# Patient Record
Sex: Female | Born: 1935 | Race: White | Hispanic: No | Smoking: Former smoker
Health system: Southern US, Community
[De-identification: ages and names within clinical notes are randomized; demographics above are authoritative.]

## PROBLEM LIST (undated history)

## (undated) DIAGNOSIS — I5030 Unspecified diastolic (congestive) heart failure: Secondary | ICD-10-CM

## (undated) DIAGNOSIS — E039 Hypothyroidism, unspecified: Secondary | ICD-10-CM

## (undated) DIAGNOSIS — Z9889 Other specified postprocedural states: Secondary | ICD-10-CM

## (undated) DIAGNOSIS — I10 Essential (primary) hypertension: Secondary | ICD-10-CM

## (undated) DIAGNOSIS — D649 Anemia, unspecified: Secondary | ICD-10-CM

## (undated) DIAGNOSIS — L989 Disorder of the skin and subcutaneous tissue, unspecified: Secondary | ICD-10-CM

## (undated) DIAGNOSIS — E611 Iron deficiency: Secondary | ICD-10-CM

## (undated) DIAGNOSIS — N189 Chronic kidney disease, unspecified: Secondary | ICD-10-CM

## (undated) DIAGNOSIS — M8580 Other specified disorders of bone density and structure, unspecified site: Secondary | ICD-10-CM

## (undated) DIAGNOSIS — M76829 Posterior tibial tendinitis, unspecified leg: Secondary | ICD-10-CM

## (undated) DIAGNOSIS — R269 Unspecified abnormalities of gait and mobility: Secondary | ICD-10-CM

## (undated) DIAGNOSIS — I509 Heart failure, unspecified: Secondary | ICD-10-CM

## (undated) DIAGNOSIS — E7800 Pure hypercholesterolemia, unspecified: Secondary | ICD-10-CM

## (undated) DIAGNOSIS — I639 Cerebral infarction, unspecified: Secondary | ICD-10-CM

## (undated) DIAGNOSIS — Z789 Other specified health status: Secondary | ICD-10-CM

## (undated) DIAGNOSIS — M545 Low back pain, unspecified: Secondary | ICD-10-CM

## (undated) DIAGNOSIS — M6289 Other specified disorders of muscle: Secondary | ICD-10-CM

## (undated) DIAGNOSIS — I6529 Occlusion and stenosis of unspecified carotid artery: Secondary | ICD-10-CM

## (undated) DIAGNOSIS — I6381 Other cerebral infarction due to occlusion or stenosis of small artery: Secondary | ICD-10-CM

## (undated) HISTORY — PX: CATARACT EXTRACTION: SUR2

## (undated) HISTORY — DX: Anemia, unspecified: D64.9

## (undated) HISTORY — DX: Low back pain, unspecified: M54.50

## (undated) HISTORY — DX: Posterior tibial tendinitis, unspecified leg: M76.829

## (undated) HISTORY — DX: Other specified disorders of bone density and structure, unspecified site: M85.80

## (undated) HISTORY — DX: Iron deficiency: E61.1

## (undated) HISTORY — DX: Pure hypercholesterolemia, unspecified: E78.00

## (undated) HISTORY — DX: Other specified health status: Z78.9

## (undated) HISTORY — DX: Unspecified abnormalities of gait and mobility: R26.9

## (undated) HISTORY — DX: Heart failure, unspecified: I50.9

## (undated) HISTORY — DX: Disorder of the skin and subcutaneous tissue, unspecified: L98.9

## (undated) HISTORY — DX: Other cerebral infarction due to occlusion or stenosis of small artery: I63.81

## (undated) HISTORY — PX: ABDOMINAL HYSTERECTOMY: SHX81

## (undated) HISTORY — DX: Hypothyroidism, unspecified: E03.9

## (undated) HISTORY — PX: KIDNEY STONE SURGERY: SHX686

## (undated) HISTORY — DX: Other specified disorders of muscle: M62.89

## (undated) HISTORY — PX: CARPAL TUNNEL RELEASE: SHX101

## (undated) HISTORY — DX: Essential (primary) hypertension: I10

## (undated) HISTORY — DX: Occlusion and stenosis of unspecified carotid artery: I65.29

## (undated) HISTORY — DX: Chronic kidney disease, unspecified: N18.9

## (undated) HISTORY — PX: BACK SURGERY: SHX140

## (undated) HISTORY — PX: LUMBAR SPINE SURGERY: SHX701

## (undated) HISTORY — DX: Unspecified diastolic (congestive) heart failure: I50.30

## (undated) HISTORY — DX: Cerebral infarction, unspecified: I63.9

## (undated) HISTORY — DX: Other specified postprocedural states: Z98.890

## (undated) HISTORY — PX: EXTERNAL EAR SURGERY: SHX627

---

## 2011-12-10 DIAGNOSIS — E039 Hypothyroidism, unspecified: Secondary | ICD-10-CM | POA: Insufficient documentation

## 2012-01-06 DIAGNOSIS — E785 Hyperlipidemia, unspecified: Secondary | ICD-10-CM | POA: Insufficient documentation

## 2012-01-06 DIAGNOSIS — N289 Disorder of kidney and ureter, unspecified: Secondary | ICD-10-CM | POA: Insufficient documentation

## 2012-01-06 DIAGNOSIS — M8580 Other specified disorders of bone density and structure, unspecified site: Secondary | ICD-10-CM | POA: Insufficient documentation

## 2014-02-04 DIAGNOSIS — N951 Menopausal and female climacteric states: Secondary | ICD-10-CM | POA: Insufficient documentation

## 2014-08-12 DIAGNOSIS — I10 Essential (primary) hypertension: Secondary | ICD-10-CM | POA: Insufficient documentation

## 2014-12-23 DIAGNOSIS — M76822 Posterior tibial tendinitis, left leg: Secondary | ICD-10-CM | POA: Insufficient documentation

## 2016-02-29 DIAGNOSIS — H3530 Unspecified macular degeneration: Secondary | ICD-10-CM | POA: Insufficient documentation

## 2016-04-21 DIAGNOSIS — Z789 Other specified health status: Secondary | ICD-10-CM | POA: Insufficient documentation

## 2016-06-25 DIAGNOSIS — D509 Iron deficiency anemia, unspecified: Secondary | ICD-10-CM | POA: Insufficient documentation

## 2016-08-14 DIAGNOSIS — M545 Low back pain, unspecified: Secondary | ICD-10-CM | POA: Insufficient documentation

## 2016-08-14 DIAGNOSIS — G8929 Other chronic pain: Secondary | ICD-10-CM | POA: Insufficient documentation

## 2019-07-07 DIAGNOSIS — Z8673 Personal history of transient ischemic attack (TIA), and cerebral infarction without residual deficits: Secondary | ICD-10-CM | POA: Insufficient documentation

## 2019-07-07 DIAGNOSIS — Z9889 Other specified postprocedural states: Secondary | ICD-10-CM | POA: Insufficient documentation

## 2019-07-07 DIAGNOSIS — N1832 Chronic kidney disease, stage 3b: Secondary | ICD-10-CM | POA: Insufficient documentation

## 2019-07-07 DIAGNOSIS — R269 Unspecified abnormalities of gait and mobility: Secondary | ICD-10-CM | POA: Insufficient documentation

## 2019-07-07 DIAGNOSIS — I6523 Occlusion and stenosis of bilateral carotid arteries: Secondary | ICD-10-CM | POA: Insufficient documentation

## 2019-07-07 DIAGNOSIS — I1310 Hypertensive heart and chronic kidney disease without heart failure, with stage 1 through stage 4 chronic kidney disease, or unspecified chronic kidney disease: Secondary | ICD-10-CM | POA: Insufficient documentation

## 2019-07-07 DIAGNOSIS — Z789 Other specified health status: Secondary | ICD-10-CM | POA: Insufficient documentation

## 2019-08-13 DIAGNOSIS — R54 Age-related physical debility: Secondary | ICD-10-CM | POA: Insufficient documentation

## 2020-05-31 NOTE — Progress Notes (Signed)
CARDIOLOGY CONSULT NOTE       Patient ID: Chelsea Moss MRN: [id number] DOB/AGE: 1926/12/09 87 y.o.  Admit date: (Not on file) Referring Physician: [facility]  Primary Physician: Lind Guest, NP Primary Cardiologist: New Reason for Consultation: PVD/HTN  HPI:  87 y.o. referred by Toney Rakes PA for HTN and PVD. Previously seen in [place] and once at [facility] cardiology She has a history of labile HTN End of August had spike and started seeing bright lights Felt clammy when EMS arrived bp fine and patient did not want to go to hospital to r/o TIA. Normally takes Olmesartan 40 mg , Laisx 20 mg , norvasc 10 mg and and clonidine  0.2 mg tid for her BP She has distant history of stroke Is intolerant to statins with myalgias She is on thyroid replacement and has history of anemia   Seen by Dr Beatrix F[facility] cardiology most recently 02/23/20 there is comment on bilateral carotid stenosis with right CEA and old Lacunar infarct She is on chronic plavix Quit smoking 1996 Chronic back pain and advanced age limits activity   Echo done 02/23/20 EF 70% mild LVH AV sclerosis MAC with mild AR/MR   Most recent labs with TSH 3 and Hct 34.5 has normal LFTls K and renal function   Her activity is very limited due to chronic back problems and balance She take an extra clonidine if BP spides She has a glass of wine nightly but no excess  She is very excitable and anxious    ROS All other systems reviewed and negative except as noted above  Past Medical History:  Diagnosis Date  . Anemia   . Carotid artery stenosis   . CHF (congestive heart failure) ([place])   . CKD (chronic kidney disease)   . Diastolic heart failure with preserved ejection fraction ([place])   . Gait disturbance   . High blood pressure   . High cholesterol   . History of CEA (carotid endarterectomy)   . Hypothyroidism   . Iron deficiency   . Lacunar infarction ([place])   . Low back pain without sciatica    . Muscular degeneration   . Osteopenia   . Skin lesion   . Statin intolerance   . Stroke (cerebrum) ([place])   . Tibialis posterior tendinitis     Family History  Problem Relation Age of Onset  . Heart disease Mother   . Emphysema Father     Social History   Socioeconomic History  . Marital status: Widowed    Spouse name: Not on file  . Number of children: Not on file  . Years of education: Not on file  . Highest education level: Not on file  Occupational History  . Not on file  Tobacco Use  . Smoking status: Former Smoker    Years: 15.00    Types: Cigarettes    Quit date: 06/01/1994    Years since quitting: 26.0  . Smokeless tobacco: Never Used  Substance and Sexual Activity  . Alcohol use: Yes  . Drug use: Never  . Sexual activity: Not on file  Other Topics Concern  . Not on file  Social History Narrative  . Not on file   Social Determinants of Health   Financial Resource Strain: Not on file  Food Insecurity: Not on file  Transportation Needs: Not on file  Physical Activity: Not on file  Stress: Not on file  Social Connections: Not on file  Intimate Partner Violence:  Not on file    Past Surgical History:  Procedure Laterality Date  . ABDOMINAL HYSTERECTOMY    . BACK SURGERY    . CARPAL TUNNEL RELEASE    . CATARACT EXTRACTION    . EXTERNAL EAR SURGERY    . KIDNEY STONE SURGERY    . LUMBAR SPINE SURGERY        Current Outpatient Medications:  .  amLODipine (NORVASC) 10 MG tablet, Take 10 mg by mouth daily., Disp: , Rfl:  .  cloNIDine (CATAPRES) 0.2 MG tablet, Take 0.2 mg by mouth 2 (two) times daily., Disp: , Rfl:  .  clopidogrel (PLAVIX) 75 MG tablet, Take 75 mg by mouth daily., Disp: , Rfl:  .  diclofenac (VOLTAREN) 75 MG EC tablet, Take 75 mg by mouth 2 (two) times daily., Disp: , Rfl:  .  estradiol (ESTRACE) 2 MG tablet, Take 2 mg by mouth daily., Disp: , Rfl:  .  ferrous sulfate 325 (65 FE) MG tablet, Take 325 mg by mouth daily with breakfast.,  Disp: , Rfl:  .  furosemide (LASIX) 20 MG tablet, Take 20 mg by mouth., Disp: , Rfl:  .  gabapentin (NEURONTIN) 600 MG tablet, Take 600 mg by mouth 3 (three) times daily., Disp: , Rfl:  .  levothyroxine (SYNTHROID) 25 MCG tablet, Take 25 mcg by mouth daily before breakfast., Disp: , Rfl:  .  pantoprazole (PROTONIX) 40 MG tablet, Take 40 mg by mouth daily., Disp: , Rfl:  .  potassium chloride (MICRO-K) 10 MEQ CR capsule, Take 10 mEq by mouth 2 (two) times daily., Disp: , Rfl:  .  triamcinolone (KENALOG) 0.1 %, Apply 1 application topically 2 (two) times daily., Disp: , Rfl:  .  valsartan (DIOVAN) 160 MG tablet, Take 1 tablet (160 mg total) by mouth daily., Disp: 90 tablet, Rfl: 3    Physical Exam: Blood pressure (!) 164/88, pulse (!) 54, height _0  (1.499 m), weight 71.2 kg, SpO2 97 %.   Affect appropriate Healthy:  appears stated age 70: normal Neck supple with no adenopathy JVP normal no bruits no thyromegaly Lungs clear with no wheezing and good diaphragmatic motion Heart:  S1/S2 no murmur, no rub, gallop or click PMI normal Abdomen: benighn, BS positve, no tenderness, no AAA no bruit.  No HSM or HJR Distal pulses intact with no bruits No edema Neuro non-focal Skin warm and dry No muscular weakness   Labs:  No results found for: WBC, HGB, HCT, MCV, PLT No results for input(s): NA, K, CL, CO2, BUN, CREATININE, CALCIUM, PROT, BILITOT, ALKPHOS, ALT, AST, GLUCOSE in the last 168 hours.  Invalid input(s): LABALBU No results found for: CKTOTAL, CKMB, CKMBINDEX, TROPONINI No results found for: CHOL No results found for: HDL No results found for: LDLCALC No results found for: TRIG No results found for: CHOLHDL No results found for: LDLDIRECT    Radiology: No results found.  EKG: SR rate 56 Normal    ASSESSMENT AND PLAN:   1. HTN: labile change benicar to valsartan which is a bit stronger continue other meds. Since her lability is related to anxiety I asked her to  see if her primary can prescribe her low dose PRN xanax  2. PVD:  No bruit will do f/u duplex in 6 months  3. Hypothyroidism:  Continue synthroid replacement TSH normal  4. Anemia:  F/u primary has had iron studies  5. HLD:  Statin intolerant given age observe   Time:  Spent reviewing outside records from primary  [place] , cardiology [place], direct patient interview exam composing note and discussing care with daughter 45 minutes   Signed: Jenkins Rouge 06/03/2020, 4:02 PM

## 2020-06-03 ENCOUNTER — Ambulatory Visit (INDEPENDENT_AMBULATORY_CARE_PROVIDER_SITE_OTHER): Payer: Medicare Other | Admitting: Cardiovascular Disease

## 2020-06-03 ENCOUNTER — Encounter: Payer: Self-pay | Admitting: Cardiovascular Disease

## 2020-06-03 ENCOUNTER — Other Ambulatory Visit: Payer: Self-pay

## 2020-06-03 VITALS — BP 164/88 | HR 54 | Ht 59.0 in | Wt 157.0 lb

## 2020-06-03 DIAGNOSIS — I6521 Occlusion and stenosis of right carotid artery: Secondary | ICD-10-CM

## 2020-06-03 DIAGNOSIS — I10 Essential (primary) hypertension: Secondary | ICD-10-CM | POA: Diagnosis not present

## 2020-06-03 MED ORDER — VALSARTAN 160 MG PO TABS: 160.0000 mg | ORAL_TABLET | Freq: Every day | ORAL | 3 refills | Status: DC

## 2020-06-03 NOTE — Patient Instructions (Signed)
Medication Instructions: \ Your physician has recommended you make the following change in your medication:  -- STOP BENICAR -- START Valsartan 160 mg - Take 1 tablet by mouth daily -- NEW RX SENT *If you need a refill on your cardiac medications before your next appointment, please call your pharmacy*  Testing/Procedures: Your physician has requested that you have a carotid duplex. This test is an ultrasound of the carotid arteries in your neck. It looks at blood flow through these arteries that supply the brain with blood. Allow one hour for this exam. There are no restrictions or special instructions.  Follow-Up: At [facility], you and your health needs are our priority.  As part of our continuing mission to provide you with exceptional heart care, we have created designated Provider Care Teams.  These Care Teams include your primary Cardiologist (physician) and Advanced Practice Providers (APPs -  Physician Assistants and Nurse Practitioners) who all work together to provide you with the care you need, when you need it.  We recommend signing up for the patient portal called "MyChart".  Sign up information is provided on this After Visit Summary.  MyChart is used to connect with patients for Virtual Visits (Telemedicine).  Patients are able to view lab/test results, encounter notes, upcoming appointments, etc.  Non-urgent messages can be sent to your provider as well.   To learn more about what you can do with MyChart, go to [email or web address].    Your next appointment:   Your physician recommends that you schedule a follow-up appointment in: 6 MONTHS with Dr. Eden Emms -- Same day as Carotid Doppler

## 2020-07-05 ENCOUNTER — Telehealth: Payer: Self-pay | Admitting: Cardiovascular Disease

## 2020-07-05 NOTE — Telephone Encounter (Signed)
    Pt c/o medication issue:  1. Name of Medication: valsartan (DIOVAN) 160 MG tablet  2. How are you currently taking this medication (dosage and times per day)?   Take 1 tablet (160 mg total) by mouth daily.   3. Are you having a reaction (difficulty breathing--STAT)?   4. What is your medication issue? Pt said her feet and legs swelling after taking this meds. She said she never had a problem like this when she was taking losartan

## 2020-07-06 ENCOUNTER — Other Ambulatory Visit: Payer: Self-pay

## 2020-07-06 MED ORDER — FUROSEMIDE 20 MG PO TABS: 20.0000 mg | ORAL_TABLET | Freq: Every day | ORAL | 3 refills | Status: DC

## 2020-07-06 MED ORDER — POTASSIUM CHLORIDE ER 10 MEQ PO CPCR: 10.0000 meq | ORAL_CAPSULE | Freq: Every day | ORAL | 3 refills | Status: DC

## 2020-07-06 NOTE — Telephone Encounter (Signed)
Pt calling stating that she needed her medication resent to [facility] mail order pharmacy. Medication resent as requested. Confirmation received.

## 2020-07-06 NOTE — Telephone Encounter (Signed)
Continue diovan and can take extra lasix/k if needed

## 2020-07-06 NOTE — Telephone Encounter (Signed)
Called patient back with Dr. Fabio Bering recommendations. Patient will take an extra lasix and potassium for any edema. Informed patient if she is having to take an extra lasix daily to give our office a call. Patient verbalized understanding.

## 2020-07-06 NOTE — Telephone Encounter (Signed)
Not related to switch in ARBs. She has furosemide on her med list. I will defer final decision to Dr. Eden Emms, but I would recommend she take an extra dose of furosemide for 1-2 days. If she does take furosemide and KCL daily then she could take an extra KCL with each extra furosemide dose.

## 2020-11-06 DIAGNOSIS — I340 Nonrheumatic mitral (valve) insufficiency: Secondary | ICD-10-CM

## 2020-11-08 DIAGNOSIS — I5030 Unspecified diastolic (congestive) heart failure: Secondary | ICD-10-CM

## 2020-11-08 DIAGNOSIS — E871 Hypo-osmolality and hyponatremia: Secondary | ICD-10-CM

## 2020-11-08 DIAGNOSIS — I10 Essential (primary) hypertension: Secondary | ICD-10-CM

## 2020-11-10 ENCOUNTER — Telehealth: Payer: Self-pay

## 2020-11-10 MED ORDER — HYDRALAZINE HCL 50 MG PO TABS: 50.0000 mg | ORAL_TABLET | Freq: Three times a day (TID) | ORAL | 3 refills | Status: DC | PRN

## 2020-11-10 NOTE — Telephone Encounter (Signed)
Called patient's daughter to let her know that hydralazine has been sent to her mother's pharmacy.

## 2020-11-10 NOTE — Telephone Encounter (Signed)
-----   Message from Wendall Stade, MD sent at 11/09/2020  9:11 PM EDT ----- Patients daughter called me at home tonight indicating recent care in [place] with edema and ? CHF Norvasc stopped BP over 200 mmHg systolic tonight Pam can you call in Hydralazine 50 mg tid PRN for her BP not sure what f/u was arranged by [facility] [place] after d/c

## 2020-11-11 ENCOUNTER — Encounter: Payer: Self-pay | Admitting: Sports Medicine

## 2020-11-11 ENCOUNTER — Ambulatory Visit (INDEPENDENT_AMBULATORY_CARE_PROVIDER_SITE_OTHER): Payer: Medicare Other | Admitting: Sports Medicine

## 2020-11-11 ENCOUNTER — Telehealth: Payer: Self-pay | Admitting: Cardiovascular Disease

## 2020-11-11 ENCOUNTER — Other Ambulatory Visit: Payer: Self-pay

## 2020-11-11 DIAGNOSIS — M2041 Other hammer toe(s) (acquired), right foot: Secondary | ICD-10-CM | POA: Diagnosis not present

## 2020-11-11 DIAGNOSIS — I5032 Chronic diastolic (congestive) heart failure: Secondary | ICD-10-CM | POA: Insufficient documentation

## 2020-11-11 DIAGNOSIS — I6381 Other cerebral infarction due to occlusion or stenosis of small artery: Secondary | ICD-10-CM | POA: Insufficient documentation

## 2020-11-11 DIAGNOSIS — M2042 Other hammer toe(s) (acquired), left foot: Secondary | ICD-10-CM | POA: Diagnosis not present

## 2020-11-11 DIAGNOSIS — L97511 Non-pressure chronic ulcer of other part of right foot limited to breakdown of skin: Secondary | ICD-10-CM

## 2020-11-11 DIAGNOSIS — L02611 Cutaneous abscess of right foot: Secondary | ICD-10-CM

## 2020-11-11 DIAGNOSIS — L03031 Cellulitis of right toe: Secondary | ICD-10-CM

## 2020-11-11 NOTE — Telephone Encounter (Signed)
Patient is requesting to switch providers from Dr. Eden Emms to Dr. Dulce Sellar due to location convenience.  Please advise when able Thanks

## 2020-11-11 NOTE — Progress Notes (Signed)
Subjective: Chelsea Moss is a 87 y.o. female patient seen in office for evaluation of ulceration of the right third toe.  Patient reports that she was seen at the hospital and had x-rays on Sunday not sure when or how the toe wound happened but does report that the nurse at the hospital pointed out the toe wound and advised her to follow-up in our office.  Patient reports that she saw her PCP on yesterday and was given Keflex and Silvadene cream that they have not started using yet.  Patient denies nausea vomiting fever chills or any constitutional symptoms.  Patient is assisted by daughter this visit.  Significant cardiac history with fluid overload currently on Plavix.  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 07/07/2019   Bilateral carotid artery stenosis 07/07/2019   Gait disturbance 07/07/2019   History of CEA (carotid endarterectomy) 07/07/2019   Hx-TIA (transient ischemic attack) 07/07/2019   Stage 3b chronic kidney disease (HCC) 07/07/2019   Statin intolerance 07/07/2019   Chronic bilateral low back pain without sciatica 08/14/2016   Iron deficiency anemia 06/25/2016   Advance directive on file 04/21/2016   Macular degeneration 02/29/2016   Left tibialis posterior tendinitis 12/23/2014   Essential hypertension 08/12/2014   Menopausal symptoms 02/04/2014   Hyperlipidemia, unspecified 01/06/2012   Osteopenia 01/06/2012   Renal insufficiency 01/06/2012   Acquired hypothyroidism 12/10/2011   Current Outpatient Medications on File Prior to Visit  Medication Sig Dispense Refill   clopidogrel (PLAVIX) 75 MG tablet Take 1 tablet by mouth daily.     ALPRAZolam (XANAX) 0.25 MG tablet Take by mouth.     amLODipine (NORVASC) 10 MG tablet Take 10 mg by mouth daily.     buPROPion  (WELLBUTRIN SR) 100 MG 12 hr tablet Take 100 mg by mouth 2 (two) times daily.     cephALEXin (KEFLEX) 500 MG capsule Take 500 mg by mouth 2 (two) times daily.     cloNIDine (CATAPRES) 0.2 MG tablet Take 0.2 mg by mouth 2 (two) times daily.     clopidogrel (PLAVIX) 75 MG tablet Take 75 mg by mouth daily.     demeclocycline (DECLOMYCIN) 150 MG tablet Take 150 mg by mouth 2 (two) times daily.     diclofenac (VOLTAREN) 75 MG EC tablet Take 75 mg by mouth 2 (two) times daily.     estradiol (ESTRACE) 2 MG tablet Take 2 mg by mouth daily.     ferrous sulfate 325 (65 FE) MG tablet Take 325 mg by mouth daily with breakfast.     furosemide (LASIX) 20 MG tablet Take 1 tablet (20 mg total) by mouth daily. Can take an extra tablet by mouth daily as needed for edema. 135 tablet 3   gabapentin (NEURONTIN) 600 MG tablet Take 600 mg by mouth 3 (three) times daily.     hydrALAZINE (APRESOLINE) 50 MG tablet Take 1 tablet (50 mg total) by mouth 3 (three) times daily as needed (for elevated BP). 270 tablet 3   HYDROcodone-acetaminophen (NORCO/VICODIN) 5-325 MG tablet Take 1 tablet by mouth every 4 (four) hours as needed.     levothyroxine (SYNTHROID) 25 MCG tablet Take 25 mcg by mouth daily before breakfast.     olmesartan (BENICAR) 40 MG tablet Take 40 mg by mouth daily.     ondansetron (  ZOFRAN-ODT) 4 MG disintegrating tablet SMARTSIG:2 Tablet(s) By Mouth Every 6-8 Hours PRN     pantoprazole (PROTONIX) 40 MG tablet Take 40 mg by mouth daily.     potassium chloride (MICRO-K) 10 MEQ CR capsule Take 1 capsule (10 mEq total) by mouth daily. Can take an extra tablet by mouth daily if you are taking an extra furosemide for edema. 135 capsule 3   sertraline (ZOLOFT) 25 MG tablet Take by mouth.     spironolactone (ALDACTONE) 25 MG tablet Take 1 tablet by mouth daily.     SSD 1 % cream Apply topically.     torsemide (DEMADEX) 20 MG tablet Take by mouth.     triamcinolone (KENALOG) 0.1 % Apply 1 application topically 2 (two)  times daily. (Patient not taking: Reported on 07/06/2020)     valsartan (DIOVAN) 160 MG tablet Take 1 tablet (160 mg total) by mouth daily. 90 tablet 3   No current facility-administered medications on file prior to visit.   Allergies  Allergen Reactions   Statins     musculoskeletal aches and pains, can't take    No results found for this or any previous visit (from the past 2160 hour(s)).  Objective: There were no vitals filed for this visit.  General: Patient is awake, alert, oriented x 3 and in no acute distress.  Dermatology: Skin is warm and dry bilateral with a partial thickness ulceration present right third toe distal tuft.  Ulceration measures 0.3 cm x 0.2 cm x 0.1 cm. There is a keratotic border with a granular base. The ulceration does not  probe to bone. There is no malodor, no active drainage, faint blanchable erythema to the right third toe, localized edema. No other acute signs of infection.   Vascular: Dorsalis Pedis pulse = 1/4 Bilateral,  Posterior Tibial pulse = 0/4 Bilateral,  Capillary Fill Time < 5 seconds  Neurologic: Protective sensation present via light touch bilateral.  Musculosketal: There is mild pain with palpation to ulcerated area.  Hammertoe gross bony deformities noted bilateral.   No results for input(s): GRAMSTAIN, LABORGA in the last 8760 hours.  Assessment and Plan:  Problem List Items Addressed This Visit   None Visit Diagnoses     Toe ulcer, right, limited to breakdown of skin (HCC)    -  Primary   Hammer toes of both feet       Cellulitis and abscess of toe of right foot            -Examined patient and discussed the progression of the wound and treatment alternatives. -Xrays reviewed from [place] that are negative no acute signs of fracture or osteomyelitis. - Excisionally dedbrided ulceration at right third toe to healthy bleeding borders removing nonviable tissue using a sterile chisel blade. Wound measures post debridement as  above. Wound was debrided to the level of the dermis with viable wound base exposed to promote healing. Hemostasis was achieved with manuel pressure. Patient tolerated procedure well without any discomfort or anesthesia necessary for this wound debridement.  -Applied Silvadene and Band-Aid dressing and advised patient and daughter to do the same at home once daily may remove Band-Aid off at night to leave open to air -Continue with Keflex as prescribed by PCP - Advised patient to go to the ER or return to office if the wound worsens or if constitutional symptoms are present. -Advised patient to avoid shoes that could rub her toes -Patient to return to office in 2 to 3 weeks for  follow up care and evaluation or sooner if problems arise.  Patient is aware that once we get her toe heals then we can set her up for routine care visit.  Asencion Islam, DPM

## 2020-11-16 ENCOUNTER — Ambulatory Visit: Payer: Medicare Other | Admitting: Sports Medicine

## 2020-11-19 ENCOUNTER — Encounter (HOSPITAL_COMMUNITY): Payer: Self-pay | Admitting: Emergency Medicine

## 2020-11-19 ENCOUNTER — Emergency Department (HOSPITAL_COMMUNITY): Payer: Medicare Other

## 2020-11-19 ENCOUNTER — Telehealth: Payer: Self-pay | Admitting: Physician Assistant

## 2020-11-19 ENCOUNTER — Other Ambulatory Visit: Payer: Self-pay

## 2020-11-19 ENCOUNTER — Inpatient Hospital Stay (HOSPITAL_COMMUNITY)
Admission: EM | Admit: 2020-11-19 | Discharge: 2020-11-29 | DRG: 304 | Disposition: A | Discharge status: Skilled Nursing Facility | Payer: Medicare Other | Attending: Internal Medicine | Admitting: Internal Medicine

## 2020-11-19 DIAGNOSIS — E785 Hyperlipidemia, unspecified: Secondary | ICD-10-CM | POA: Diagnosis not present

## 2020-11-19 DIAGNOSIS — N1832 Chronic kidney disease, stage 3b: Secondary | ICD-10-CM | POA: Diagnosis present

## 2020-11-19 DIAGNOSIS — Z79890 Hormone replacement therapy (postmenopausal): Secondary | ICD-10-CM

## 2020-11-19 DIAGNOSIS — G8929 Other chronic pain: Secondary | ICD-10-CM | POA: Diagnosis present

## 2020-11-19 DIAGNOSIS — G450 Vertebro-basilar artery syndrome: Secondary | ICD-10-CM

## 2020-11-19 DIAGNOSIS — I1310 Hypertensive heart and chronic kidney disease without heart failure, with stage 1 through stage 4 chronic kidney disease, or unspecified chronic kidney disease: Secondary | ICD-10-CM | POA: Diagnosis present

## 2020-11-19 DIAGNOSIS — Z8673 Personal history of transient ischemic attack (TIA), and cerebral infarction without residual deficits: Secondary | ICD-10-CM

## 2020-11-19 DIAGNOSIS — Z20822 Contact with and (suspected) exposure to covid-19: Secondary | ICD-10-CM | POA: Diagnosis present

## 2020-11-19 DIAGNOSIS — E039 Hypothyroidism, unspecified: Secondary | ICD-10-CM | POA: Diagnosis present

## 2020-11-19 DIAGNOSIS — I651 Occlusion and stenosis of basilar artery: Secondary | ICD-10-CM

## 2020-11-19 DIAGNOSIS — K219 Gastro-esophageal reflux disease without esophagitis: Secondary | ICD-10-CM | POA: Diagnosis present

## 2020-11-19 DIAGNOSIS — I161 Hypertensive emergency: Secondary | ICD-10-CM | POA: Diagnosis not present

## 2020-11-19 DIAGNOSIS — I5032 Chronic diastolic (congestive) heart failure: Secondary | ICD-10-CM | POA: Diagnosis present

## 2020-11-19 DIAGNOSIS — Z9114 Patient's other noncompliance with medication regimen: Secondary | ICD-10-CM

## 2020-11-19 DIAGNOSIS — Z7902 Long term (current) use of antithrombotics/antiplatelets: Secondary | ICD-10-CM

## 2020-11-19 DIAGNOSIS — Z87891 Personal history of nicotine dependence: Secondary | ICD-10-CM

## 2020-11-19 DIAGNOSIS — N179 Acute kidney failure, unspecified: Secondary | ICD-10-CM | POA: Diagnosis present

## 2020-11-19 DIAGNOSIS — G9341 Metabolic encephalopathy: Secondary | ICD-10-CM | POA: Diagnosis present

## 2020-11-19 DIAGNOSIS — M545 Low back pain, unspecified: Secondary | ICD-10-CM | POA: Diagnosis present

## 2020-11-19 DIAGNOSIS — Z90710 Acquired absence of both cervix and uterus: Secondary | ICD-10-CM

## 2020-11-19 DIAGNOSIS — Z79899 Other long term (current) drug therapy: Secondary | ICD-10-CM

## 2020-11-19 DIAGNOSIS — I671 Cerebral aneurysm, nonruptured: Secondary | ICD-10-CM | POA: Diagnosis present

## 2020-11-19 DIAGNOSIS — Z66 Do not resuscitate: Secondary | ICD-10-CM | POA: Diagnosis present

## 2020-11-19 DIAGNOSIS — D509 Iron deficiency anemia, unspecified: Secondary | ICD-10-CM | POA: Diagnosis present

## 2020-11-19 DIAGNOSIS — Z8249 Family history of ischemic heart disease and other diseases of the circulatory system: Secondary | ICD-10-CM

## 2020-11-19 DIAGNOSIS — E7800 Pure hypercholesterolemia, unspecified: Secondary | ICD-10-CM | POA: Diagnosis present

## 2020-11-19 DIAGNOSIS — I160 Hypertensive urgency: Secondary | ICD-10-CM

## 2020-11-19 DIAGNOSIS — I2510 Atherosclerotic heart disease of native coronary artery without angina pectoris: Secondary | ICD-10-CM | POA: Diagnosis present

## 2020-11-19 DIAGNOSIS — E871 Hypo-osmolality and hyponatremia: Secondary | ICD-10-CM | POA: Diagnosis present

## 2020-11-19 DIAGNOSIS — I130 Hypertensive heart and chronic kidney disease with heart failure and stage 1 through stage 4 chronic kidney disease, or unspecified chronic kidney disease: Secondary | ICD-10-CM | POA: Diagnosis present

## 2020-11-19 DIAGNOSIS — D72829 Elevated white blood cell count, unspecified: Secondary | ICD-10-CM | POA: Diagnosis present

## 2020-11-19 LAB — URINALYSIS, ROUTINE W REFLEX MICROSCOPIC
Bilirubin Urine: NEGATIVE
Glucose, UA: NEGATIVE mg/dL
Hgb urine dipstick: NEGATIVE
Ketones, ur: NEGATIVE mg/dL
Leukocytes,Ua: NEGATIVE
Nitrite: NEGATIVE
Protein, ur: NEGATIVE mg/dL
Specific Gravity, Urine: 1.01 (ref 1.005–1.030)
pH: 6 (ref 5.0–8.0)

## 2020-11-19 LAB — CBC WITH DIFFERENTIAL/PLATELET
Abs Immature Granulocytes: 0.03 10*3/uL (ref 0.00–0.07)
Basophils Absolute: 0 10*3/uL (ref 0.0–0.1)
Basophils Relative: 0 %
Eosinophils Absolute: 0 10*3/uL (ref 0.0–0.5)
Eosinophils Relative: 0 %
HCT: 33.7 % — ABNORMAL LOW (ref 36.0–46.0)
Hemoglobin: 11.3 g/dL — ABNORMAL LOW (ref 12.0–15.0)
Immature Granulocytes: 0 %
Lymphocytes Relative: 6 %
Lymphs Abs: 0.7 10*3/uL (ref 0.7–4.0)
MCH: 32.8 pg (ref 26.0–34.0)
MCHC: 33.5 g/dL (ref 30.0–36.0)
MCV: 97.7 fL (ref 80.0–100.0)
Monocytes Absolute: 0.6 10*3/uL (ref 0.1–1.0)
Monocytes Relative: 5 %
Neutro Abs: 10 10*3/uL — ABNORMAL HIGH (ref 1.7–7.7)
Neutrophils Relative %: 89 %
Platelets: 369 10*3/uL (ref 150–400)
RBC: 3.45 MIL/uL — ABNORMAL LOW (ref 3.87–5.11)
RDW: 12.6 % (ref 11.5–15.5)
WBC: 11.4 10*3/uL — ABNORMAL HIGH (ref 4.0–10.5)
nRBC: 0 % (ref 0.0–0.2)

## 2020-11-19 LAB — COMPREHENSIVE METABOLIC PANEL
ALT: 18 U/L (ref 0–44)
AST: 27 U/L (ref 15–41)
Albumin: 3.7 g/dL (ref 3.5–5.0)
Alkaline Phosphatase: 55 U/L (ref 38–126)
Anion gap: 11 (ref 5–15)
BUN: 25 mg/dL — ABNORMAL HIGH (ref 8–23)
CO2: 22 mmol/L (ref 22–32)
Calcium: 9.8 mg/dL (ref 8.9–10.3)
Chloride: 98 mmol/L (ref 98–111)
Creatinine, Ser: 1.23 mg/dL — ABNORMAL HIGH (ref 0.44–1.00)
GFR, Estimated: 41 mL/min — ABNORMAL LOW (ref >60)
Glucose, Bld: 181 mg/dL — ABNORMAL HIGH (ref 70–99)
Potassium: 4.2 mmol/L (ref 3.5–5.1)
Sodium: 131 mmol/L — ABNORMAL LOW (ref 135–145)
Total Bilirubin: 0.5 mg/dL (ref 0.3–1.2)
Total Protein: 6.7 g/dL (ref 6.5–8.1)

## 2020-11-19 LAB — TROPONIN I (HIGH SENSITIVITY)
Troponin I (High Sensitivity): 189 ng/L (ref <18)
Troponin I (High Sensitivity): 176 ng/L (ref <18)

## 2020-11-19 LAB — BRAIN NATRIURETIC PEPTIDE: B Natriuretic Peptide: 295.1 pg/mL — ABNORMAL HIGH (ref 0.0–100.0)

## 2020-11-19 LAB — T4, FREE: Free T4: 1.13 ng/dL — ABNORMAL HIGH (ref 0.61–1.12)

## 2020-11-19 LAB — CBG MONITORING, ED: Glucose-Capillary: 182 mg/dL — ABNORMAL HIGH (ref 70–99)

## 2020-11-19 LAB — TSH: TSH: 3.59 u[IU]/mL (ref 0.350–4.500)

## 2020-11-19 LAB — LACTIC ACID, PLASMA
Lactic Acid, Venous: 1.4 mmol/L (ref 0.5–1.9)
Lactic Acid, Venous: 1.1 mmol/L (ref 0.5–1.9)

## 2020-11-19 LAB — RESP PANEL BY RT-PCR (FLU A&B, COVID) ARPGX2
Influenza A by PCR: NEGATIVE
Influenza B by PCR: NEGATIVE
SARS Coronavirus 2 by RT PCR: NEGATIVE

## 2020-11-19 IMAGING — CR DG CHEST 2V
2 series · 2 of 2 positions shown · non-contrast
Comparison: None.

CLINICAL DATA: Altered mental status

EXAM:
CHEST - 2 VIEW

[chest lat]
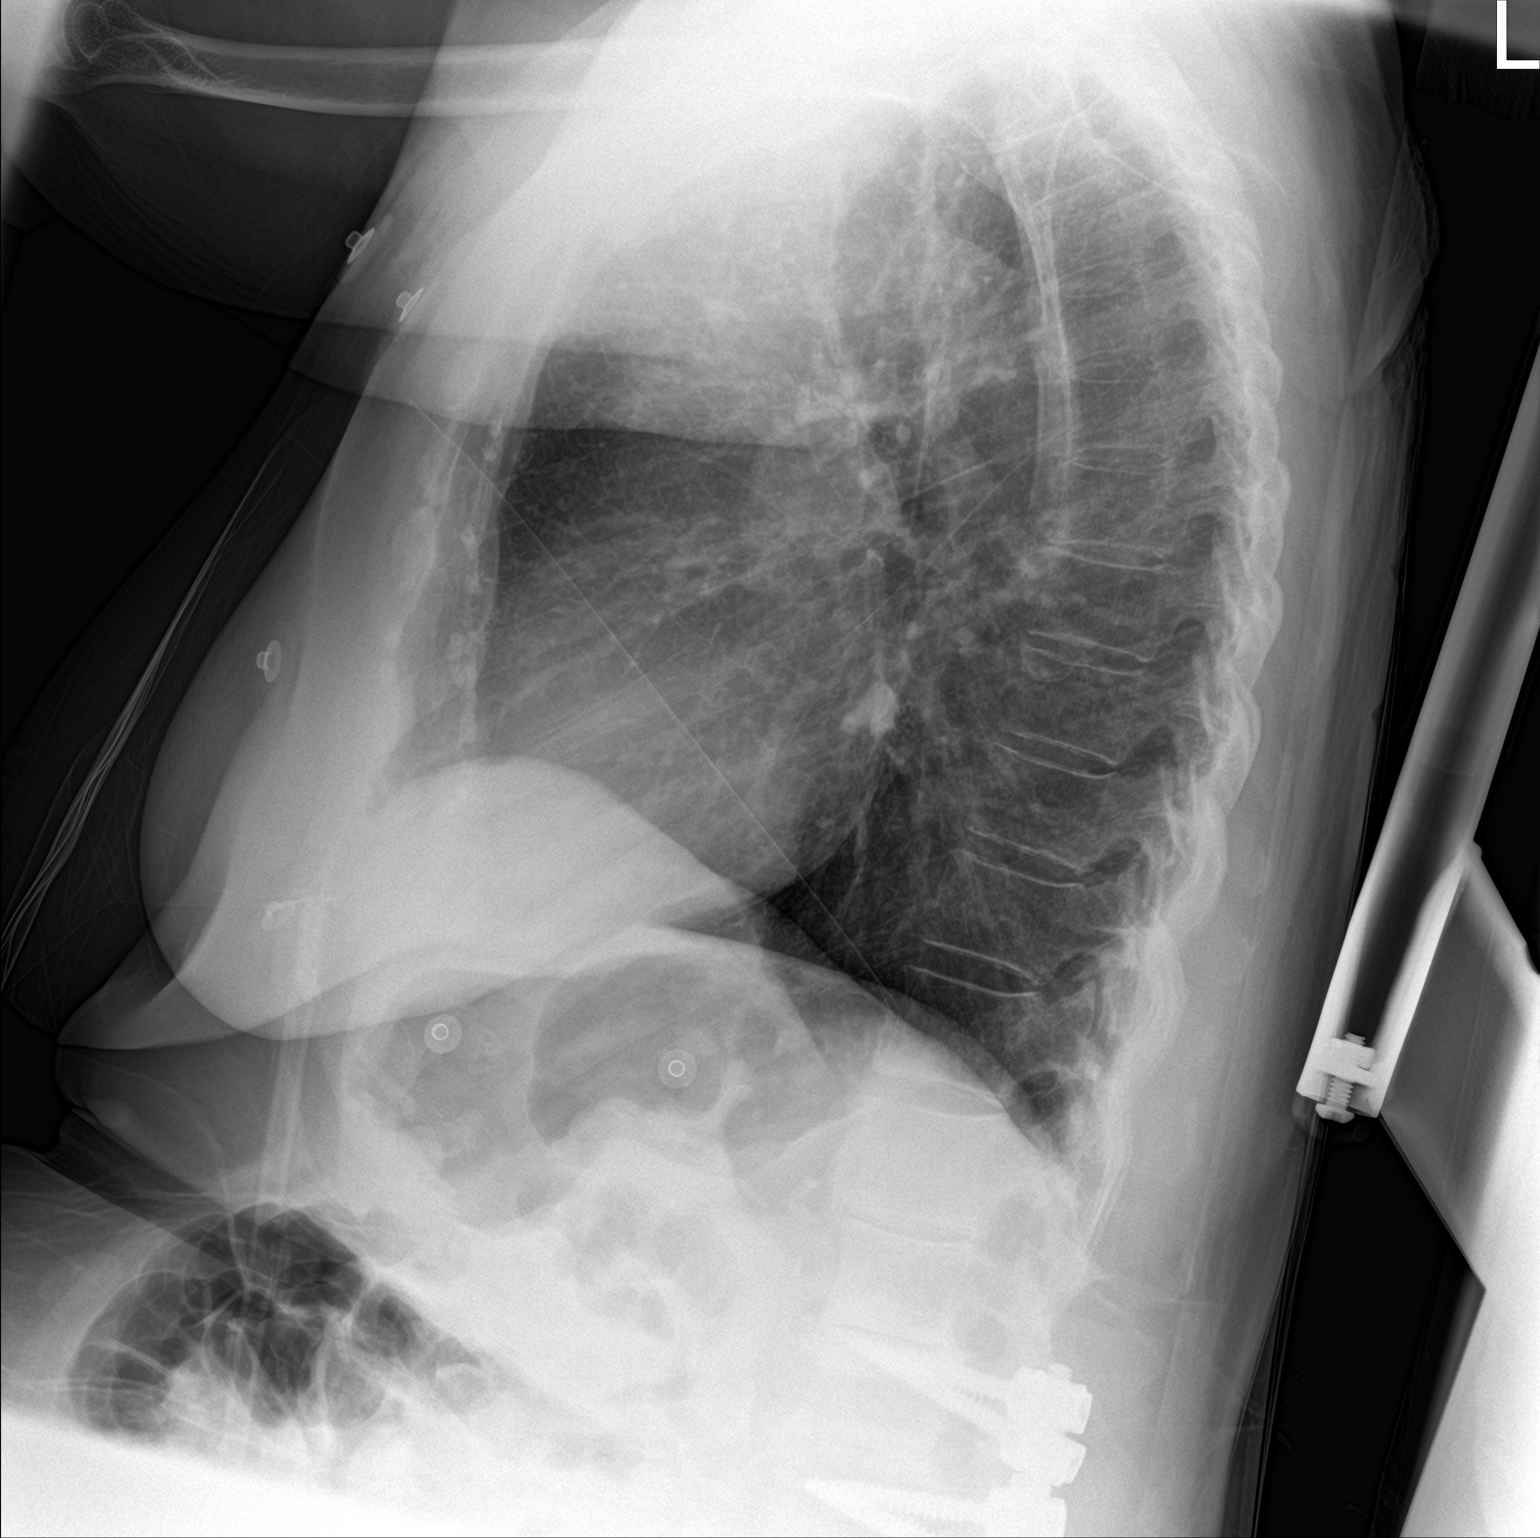

[chest ap]
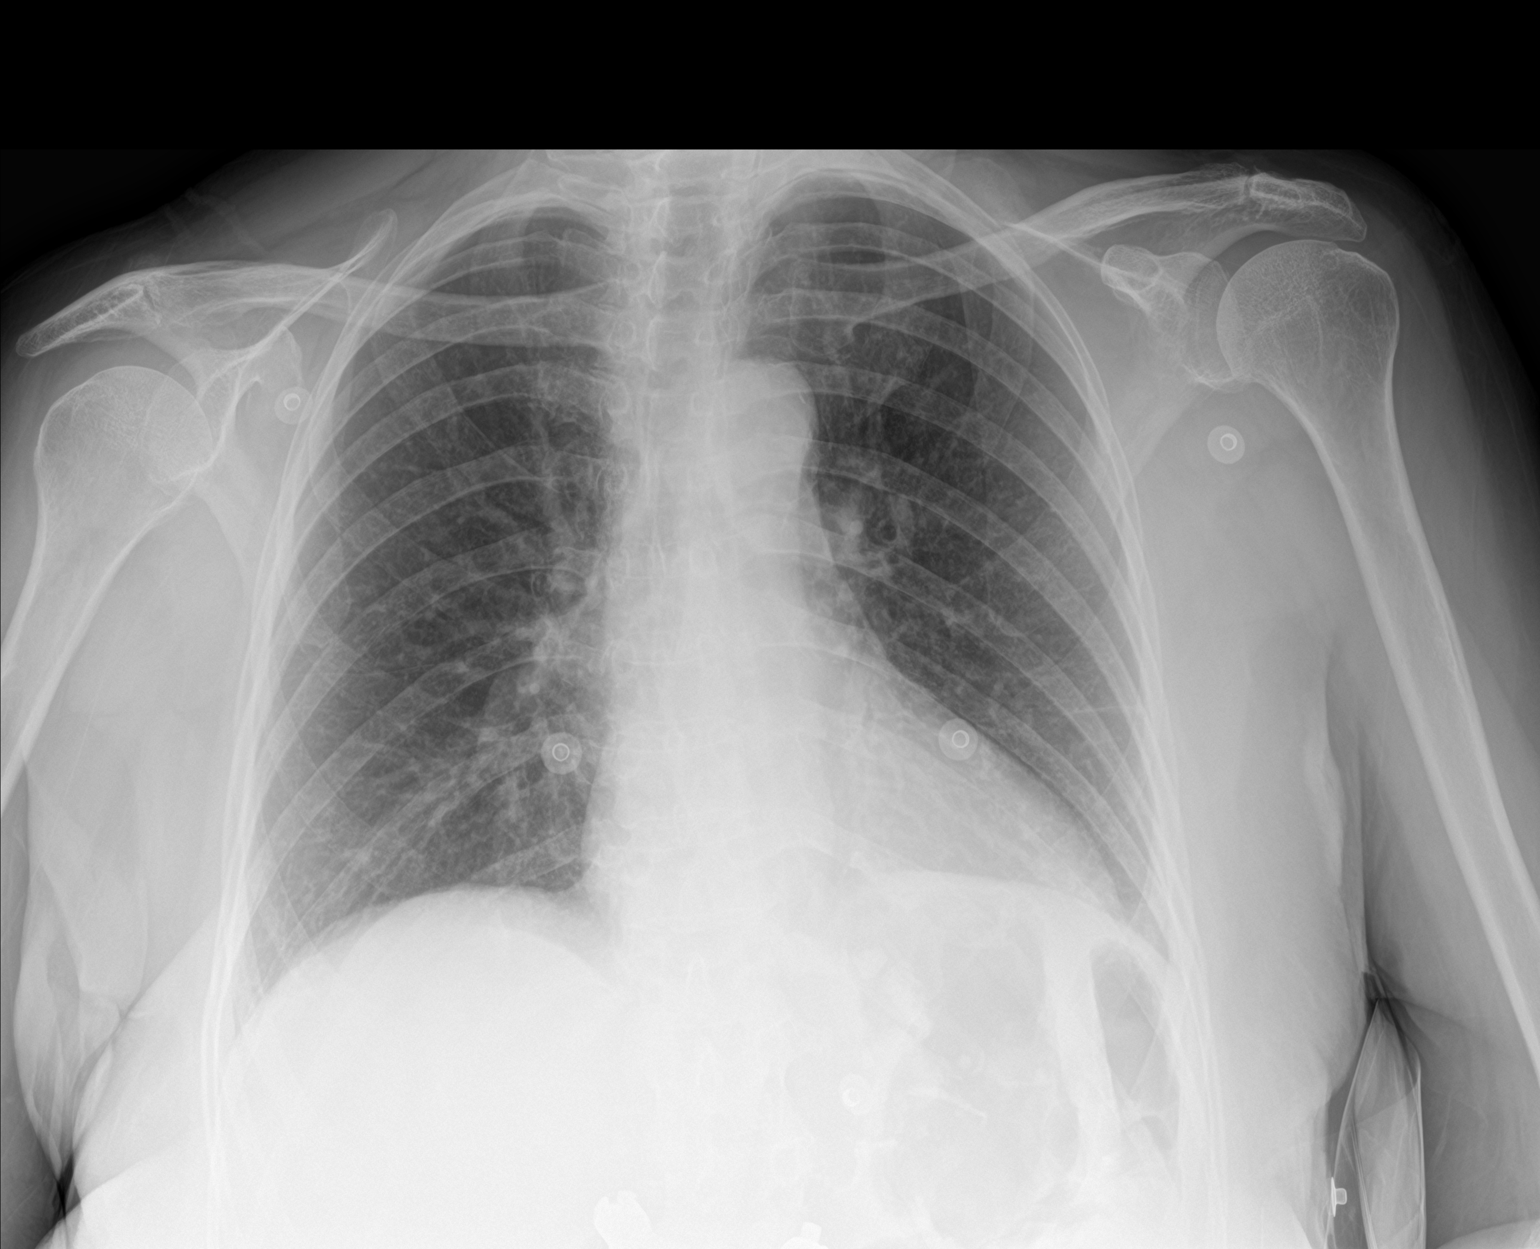

[2 of 2 positions shown; findings below may reference images not displayed]

FINDINGS: The cardiomediastinal silhouette is unremarkable.

There is no evidence of focal airspace disease, pulmonary edema,
suspicious pulmonary nodule/mass, pleural effusion, or pneumothorax.

No acute bony abnormalities are identified.

Lumbar spine surgical hardware noted.
IMPRESSION: No active cardiopulmonary disease.

## 2020-11-19 IMAGING — CT CT HEAD W/O CM
4 series · 16 of 47 positions shown, 18 images · non-contrast
Comparison: None.

CLINICAL DATA: [AGE] female with altered mental status.

EXAM:
CT HEAD WITHOUT CONTRAST
TECHNIQUE: Contiguous axial images were obtained from the base of the skull
through the vertex without intravenous contrast.

[Series 3: head wo · axial · 0.46mm/px · z∈[-201,-81]mm · 7 of 32 slices shown, 9 images]
[im 4/32  brain]
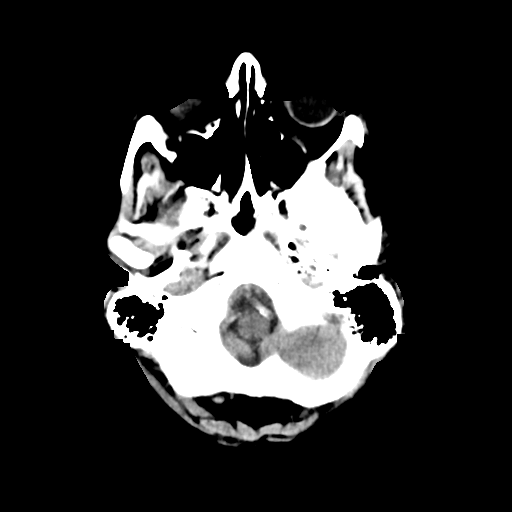
[im 4/32  bone]
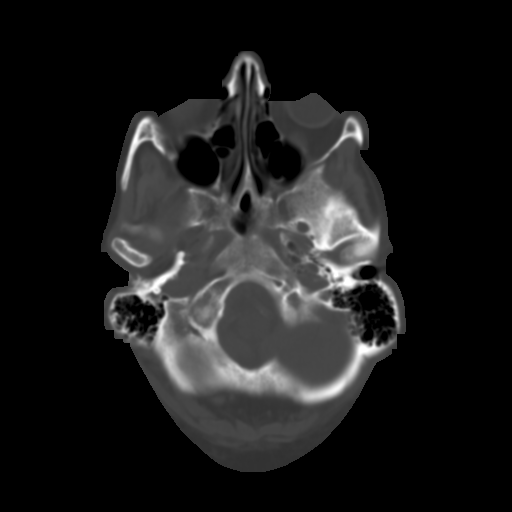
[im 8/32  brain]
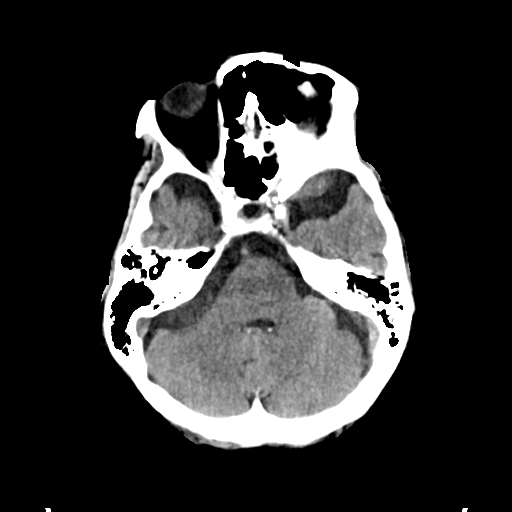
[im 12/32  brain]
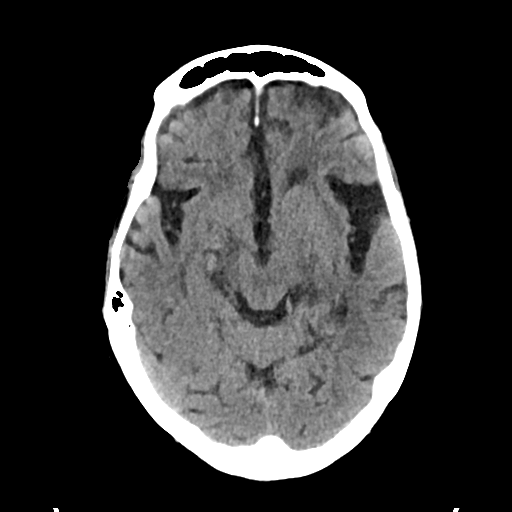
[im 16/32  brain]
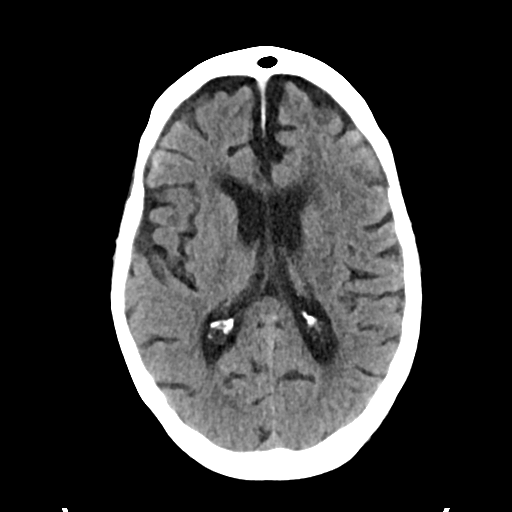
[im 20/32  brain]
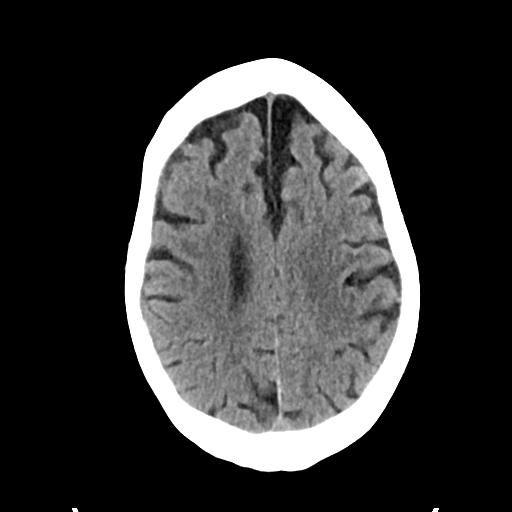
[im 20/32  bone]
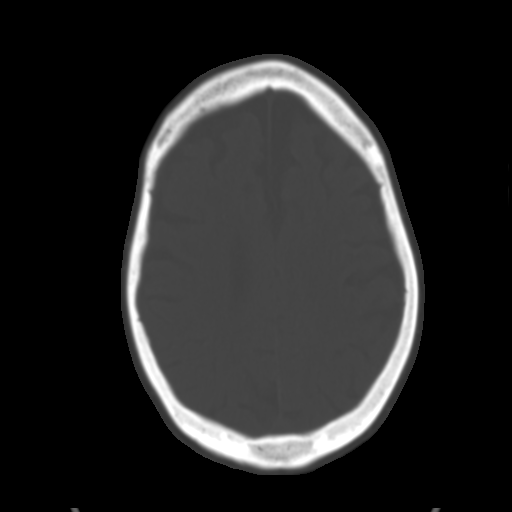
[im 24/32  brain]
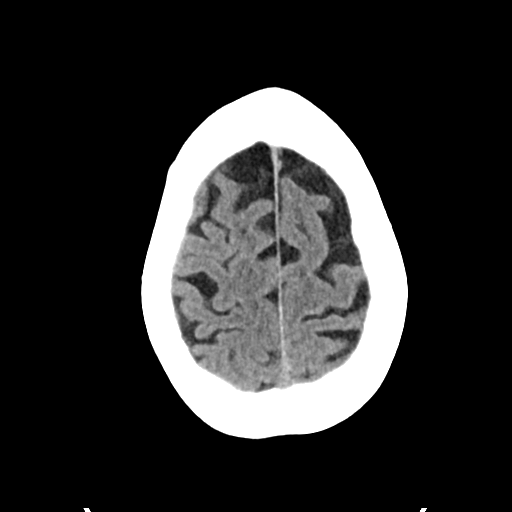
[im 28/32  brain]
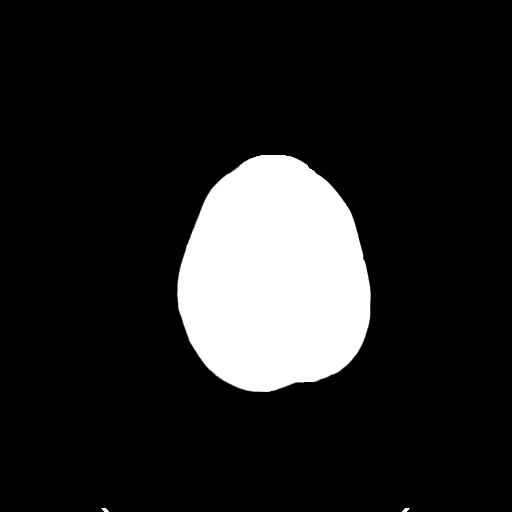

[Series 4: head bone · axial · 0.46mm/px · z∈[-202,-170]mm · 3 of 79 slices shown]
[im 8/79  bone]
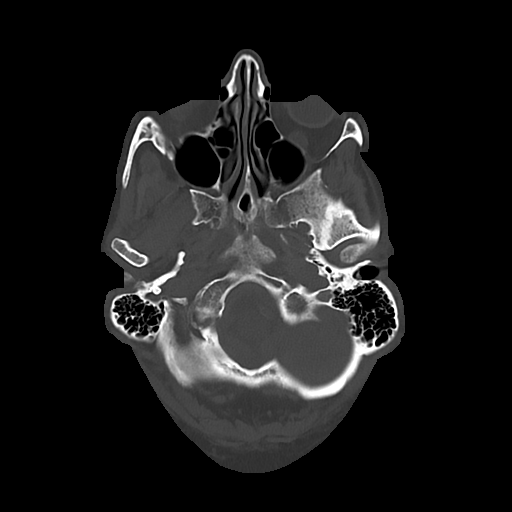
[im 16/79  bone]
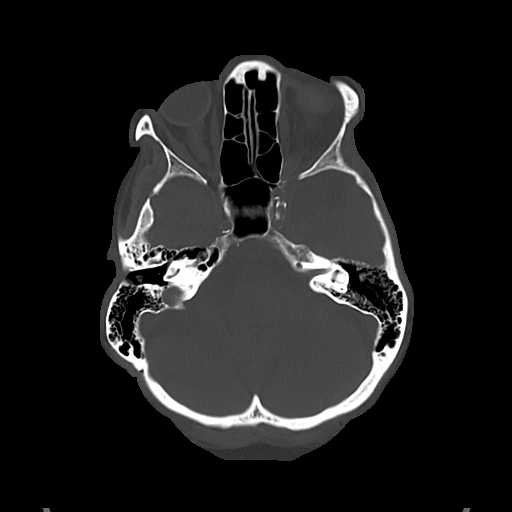
[im 24/79  bone]
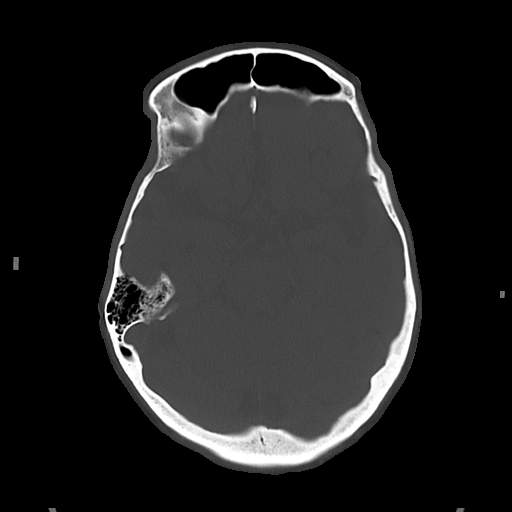

[Series 5: cor soft · coronal · 0.32mm/px · 3 of 74 slices shown]
[im 25/74  brain]
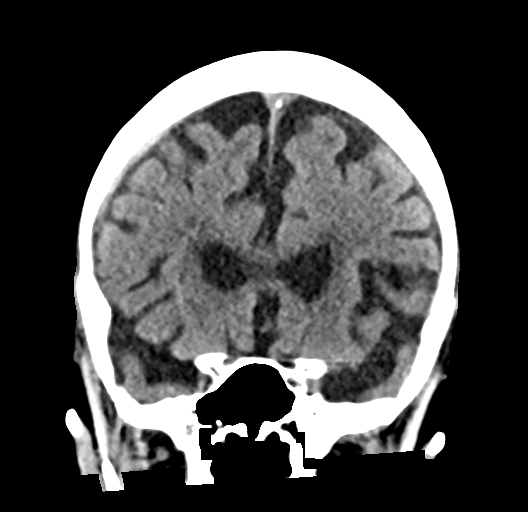
[im 33/74  brain]
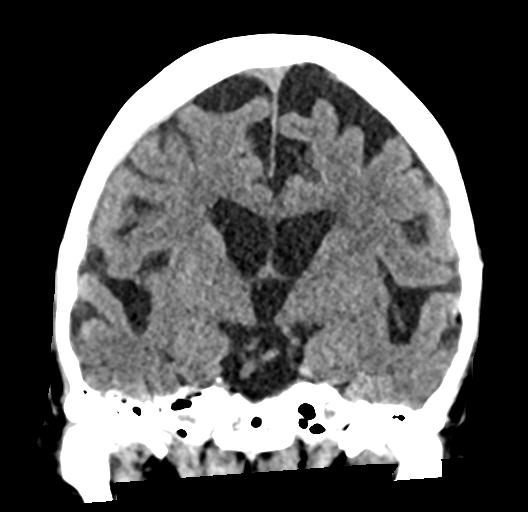
[im 41/74  brain]
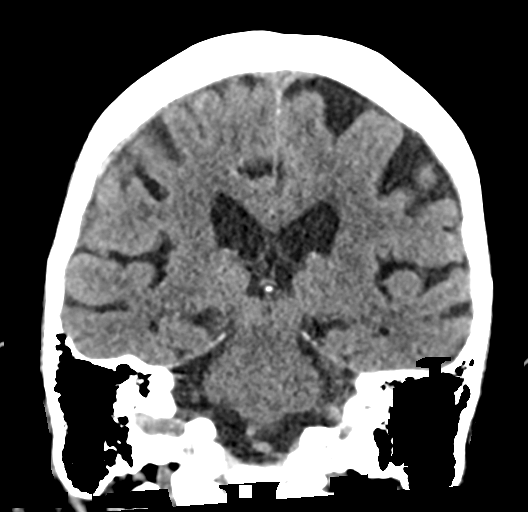

[Series 6: sag soft · sagittal · 0.32mm/px · 3 of 56 slices shown]
[im 19/56  brain]
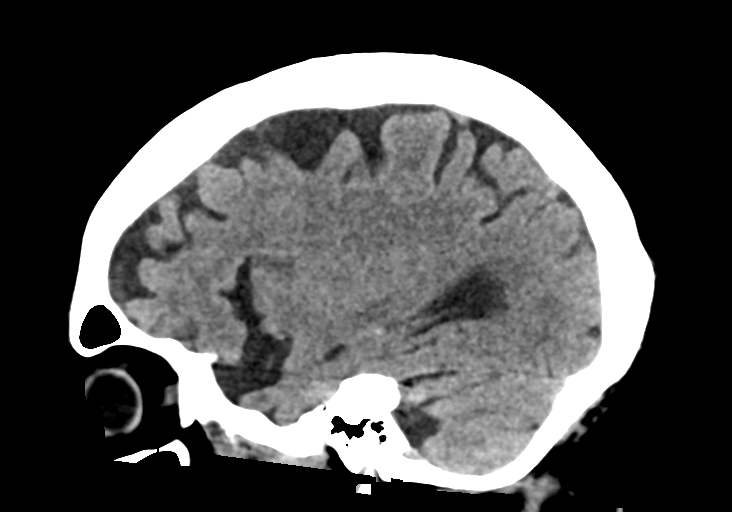
[im 28/56  brain]
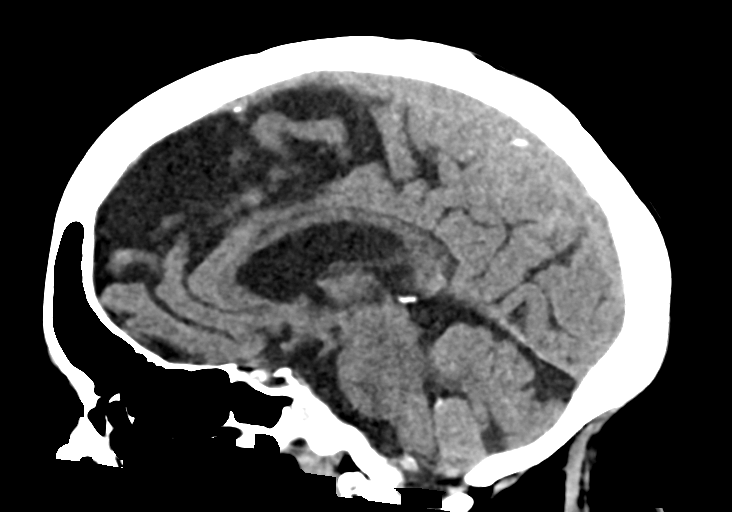
[im 37/56  brain]
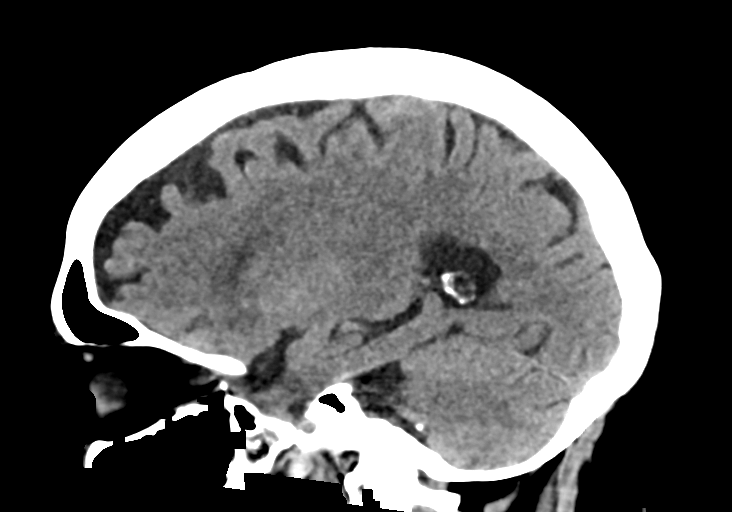

[16 of 47 positions shown; findings below may reference images not displayed]

FINDINGS: Brain: No evidence of acute infarction, hemorrhage, hydrocephalus,
extra-axial collection or mass lesion/mass effect.

Atrophy and chronic small-vessel white ischemic changes are noted.

Vascular: Carotid atherosclerotic calcifications are noted.

Skull: Normal. Negative for fracture or focal lesion.

Sinuses/Orbits: No acute finding.

Other: None.
IMPRESSION: 1. No evidence of acute intracranial abnormality.
2. Atrophy and chronic small-vessel white ischemic changes.

## 2020-11-19 MED ORDER — CLONIDINE HCL 0.2 MG PO TABS: 0.2000 mg | ORAL_TABLET | Freq: Every day | ORAL | Status: DC

## 2020-11-19 MED ORDER — PANTOPRAZOLE SODIUM 40 MG PO TBEC
40.0000 mg | DELAYED_RELEASE_TABLET | Freq: Every morning | ORAL | Status: DC
  Administered 2020-11-20 – 2020-11-29 (×10): 40 mg via ORAL
  Filled 2020-11-19 (×9): qty 1

## 2020-11-19 MED ORDER — FUROSEMIDE 10 MG/ML IJ SOLN
40.0000 mg | Freq: Once | INTRAMUSCULAR | Status: AC
  Administered 2020-11-19: 40 mg via INTRAVENOUS
  Filled 2020-11-19: qty 4

## 2020-11-19 MED ORDER — LEVOTHYROXINE SODIUM 25 MCG PO TABS
25.0000 ug | ORAL_TABLET | Freq: Every day | ORAL | Status: DC
  Administered 2020-11-20 – 2020-11-29 (×10): 25 ug via ORAL
  Filled 2020-11-19 (×10): qty 1

## 2020-11-19 MED ORDER — SODIUM CHLORIDE 0.9% FLUSH
3.0000 mL | Freq: Two times a day (BID) | INTRAVENOUS | Status: DC
  Administered 2020-11-20 – 2020-11-29 (×19): 3 mL via INTRAVENOUS

## 2020-11-19 MED ORDER — ACETAMINOPHEN 325 MG PO TABS
650.0000 mg | ORAL_TABLET | Freq: Four times a day (QID) | ORAL | Status: DC | PRN
  Administered 2020-11-20 – 2020-11-28 (×11): 650 mg via ORAL
  Filled 2020-11-19 (×11): qty 2

## 2020-11-19 MED ORDER — FERROUS SULFATE 325 (65 FE) MG PO TABS
325.0000 mg | ORAL_TABLET | Freq: Every day | ORAL | Status: DC
  Administered 2020-11-20 – 2020-11-29 (×10): 325 mg via ORAL
  Filled 2020-11-19 (×9): qty 1

## 2020-11-19 MED ORDER — IRBESARTAN 150 MG PO TABS: 150.0000 mg | ORAL_TABLET | Freq: Every day | ORAL | Status: DC

## 2020-11-19 MED ORDER — CLOPIDOGREL BISULFATE 75 MG PO TABS
75.0000 mg | ORAL_TABLET | Freq: Every day | ORAL | Status: DC
  Administered 2020-11-20 – 2020-11-28 (×10): 75 mg via ORAL
  Filled 2020-11-19 (×10): qty 1

## 2020-11-19 MED ORDER — POLYETHYLENE GLYCOL 3350 17 G PO PACK
17.0000 g | PACK | Freq: Every day | ORAL | Status: DC | PRN
  Administered 2020-11-22 – 2020-11-27 (×3): 17 g via ORAL
  Filled 2020-11-19 (×3): qty 1

## 2020-11-19 MED ORDER — ACETAMINOPHEN 650 MG RE SUPP: 650.0000 mg | Freq: Four times a day (QID) | RECTAL | Status: DC | PRN

## 2020-11-19 MED ORDER — HYDRALAZINE HCL 20 MG/ML IJ SOLN
2.0000 mg | INTRAMUSCULAR | Status: DC | PRN
  Administered 2020-11-20 – 2020-11-21 (×3): 2 mg via INTRAVENOUS
  Filled 2020-11-19 (×3): qty 1

## 2020-11-19 MED ORDER — SILVER SULFADIAZINE 1 % EX CREA
1.0000 "application " | TOPICAL_CREAM | Freq: Every day | CUTANEOUS | Status: DC
  Administered 2020-11-20 – 2020-11-24 (×5): 1 via TOPICAL
  Filled 2020-11-19: qty 85

## 2020-11-19 MED ORDER — DICLOFENAC SODIUM 1 % EX GEL
2.0000 g | Freq: Once | CUTANEOUS | Status: AC
  Administered 2020-11-19: 2 g via TOPICAL
  Filled 2020-11-19: qty 100

## 2020-11-19 MED ORDER — CEPHALEXIN 250 MG PO CAPS: 500.0000 mg | ORAL_CAPSULE | Freq: Two times a day (BID) | ORAL | Status: DC

## 2020-11-19 MED ORDER — ENOXAPARIN SODIUM 40 MG/0.4ML IJ SOSY
40.0000 mg | PREFILLED_SYRINGE | Freq: Every day | INTRAMUSCULAR | Status: DC
  Administered 2020-11-20 – 2020-11-22 (×3): 40 mg via SUBCUTANEOUS
  Filled 2020-11-19 (×3): qty 0.4

## 2020-11-19 MED ORDER — HYDRALAZINE HCL 20 MG/ML IJ SOLN
5.0000 mg | Freq: Once | INTRAMUSCULAR | Status: AC
  Administered 2020-11-19: 5 mg via INTRAVENOUS
  Filled 2020-11-19: qty 1

## 2020-11-19 MED ORDER — CLONIDINE HCL 0.2 MG PO TABS
0.2000 mg | ORAL_TABLET | Freq: Three times a day (TID) | ORAL | Status: DC
  Administered 2020-11-20: 0.2 mg via ORAL
  Filled 2020-11-19: qty 1

## 2020-11-19 MED ORDER — GABAPENTIN 300 MG PO CAPS
300.0000 mg | ORAL_CAPSULE | Freq: Every day | ORAL | Status: DC
  Administered 2020-11-20 – 2020-11-28 (×10): 300 mg via ORAL
  Filled 2020-11-19 (×5): qty 1
  Filled 2020-11-19: qty 3
  Filled 2020-11-19 (×4): qty 1

## 2020-11-19 NOTE — ED Provider Notes (Signed)
Emergency Medicine Provider Triage Evaluation Note  Chelsea Moss , a 87 y.o. female  was evaluated in triage.  Patient was sent to emergency department by family members for reports of disorientation.  Patient had similar episodes of disorientation a few weeks ago and had hyponatremia at [facility].  Family member also reports that patient has had low temperature readings at home.  Upon arrival patient is alert to person, place, and time.  Patient endorses neck pain but denies any other symptoms.  Review of Systems  Positive: Neck pain Negative:   Physical Exam  BP (!) 159/51 (BP Location: Left Arm)   Pulse 65   Temp (!) 93.3 F (34.1 C)   Resp 16   SpO2 97%  Gen:   Awake, no distress   Resp:  Normal effort  MSK:   Moves extremities without difficulty  Other:  No facial asymmetry or slurred speech.  Neck is supple and nontender.  Medical Decision Making  Medically screening exam initiated at 2:39 PM.  Appropriate orders placed.  Edwardine Deschepper was informed that the remainder of the evaluation will be completed by another provider, this initial triage assessment does not replace that evaluation, and the importance of remaining in the ED until their evaluation is complete.  The patient appears stable so that the remainder of the work up may be completed by another provider.      Haskel Schroeder, PA-C 11/19/20 1441    Koleen Distance, MD 11/19/20 [phone]

## 2020-11-19 NOTE — Telephone Encounter (Signed)
   The patient's daughter Chelsea Moss called the answering service after-hours today to let us know the patient is going to ER with symptoms of CHF (6 lb weight gain, "water on the legs", SBP 226). She has been confused this morning. They are calling 911. Daughter inquired where best to transport her for specialty care as they were told [facility] may not have nephrology services. Have suggested [facility]. Will send to Dr. Eden Emms as [facility] only.  Laurann Montana, PA-C

## 2020-11-19 NOTE — ED Provider Notes (Signed)
[facility] EMERGENCY DEPARTMENT Provider Note   CSN: [id number] Arrival date & time: 11/19/20  1418     History No chief complaint on file.   Chelsea Moss is a 87 y.o. female.  HPI 89yoF pmhx carotid artery stenosis (on plavix, s/p carotid endarterectomy), stroke, chf, ckd, hfpef, htn, hld, hypothyroidism, presenting for AMS. Per daughter Bjorn Loser, [place]), patient has had recurrent AMS w/ worsening bouts of HTN, hyponatremia, hypothermia, hyperkalemia over the past ~51mo. She was most recently hospitalized at [facility] x3d last weekend, and has had worsened AMS. She is seen by Dr. Carlena Sax (sp?) and Dr. Eden Emms. They were instructed to present for admission if she had worsening sx. Daughter is additionally concerned that patient has been living on her own, and is unlikely to have been taking medications as prescribed. Here, she is again hypothermic, now w/ mild photophobia and chills. She also recently was taken off of amlodipine and switched to prn hydralazine. No further medical concern at this time, including fevers, CP, SOB, NVDC, rhinorrhea, sore throat, dysuria, hematuria, syncope, seizures.     Past Medical History:  Diagnosis Date   Anemia    Carotid artery stenosis    CHF (congestive heart failure) (HCC)    CKD (chronic kidney disease)    Diastolic heart failure with preserved ejection fraction (HCC)    Gait disturbance    High blood pressure    High cholesterol    History of CEA (carotid endarterectomy)    Hypothyroidism    Iron deficiency    Lacunar infarction (HCC)    Low back pain without sciatica    Muscular degeneration    Osteopenia    Skin lesion    Statin intolerance    Stroke (cerebrum) (HCC)    Tibialis posterior tendinitis     Patient Active Problem List   Diagnosis Date Noted   Hypertensive emergency 11/19/2020   Chronic heart failure with preserved ejection fraction (HCC) 11/11/2020   Lacunar stroke (HCC) 11/11/2020   Advanced age  60/25/2021   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 07/07/2019   Bilateral carotid artery stenosis 07/07/2019   Gait disturbance 07/07/2019   History of CEA (carotid endarterectomy) 07/07/2019   Hx-TIA (transient ischemic attack) 07/07/2019   Stage 3b chronic kidney disease (HCC) 07/07/2019   Statin intolerance 07/07/2019   Chronic bilateral low back pain without sciatica 08/14/2016   Iron deficiency anemia 06/25/2016   Advance directive on file 04/21/2016   Macular degeneration 02/29/2016   Left tibialis posterior tendinitis 12/23/2014   Essential hypertension 08/12/2014   Menopausal symptoms 02/04/2014   Hyperlipidemia, unspecified 01/06/2012   Osteopenia 01/06/2012   Renal insufficiency 01/06/2012   Acquired hypothyroidism 12/10/2011    Past Surgical History:  Procedure Laterality Date   ABDOMINAL HYSTERECTOMY     BACK SURGERY     CARPAL TUNNEL RELEASE     CATARACT EXTRACTION     EXTERNAL EAR SURGERY     KIDNEY STONE SURGERY     LUMBAR SPINE SURGERY       OB History   No obstetric history on file.     Family History  Problem Relation Age of Onset   Heart disease Mother    Emphysema Father     Social History   Tobacco Use   Smoking status: Former    Years: 15.00    Pack years: 0.00    Types: Cigarettes    Quit date: 06/01/1994    Years since quitting: 26.4  Smokeless tobacco: Never  Substance Use Topics   Alcohol use: Yes   Drug use: Never    Home Medications Prior to Admission medications   Medication Sig Start Date End Date Taking? Authorizing Provider  acetaminophen (TYLENOL) 500 MG tablet Take 1,000 mg by mouth at bedtime.   Yes [provider]  BIOTIN PO Take 1 tablet by mouth daily with lunch.   Yes [provider]  busPIRone (BUSPAR) 5 MG tablet Take 5 mg by mouth 2 (two) times daily. 11/14/20  Yes [provider]  Calcium Carbonate-Vitamin D (CALCIUM-D PO) Take 1 tablet by mouth See admin instructions.  1 tablet by mouth daily with lunch and at bedtime   Yes [provider]  cephALEXin (KEFLEX) 500 MG capsule Take 500 mg by mouth 2 (two) times daily. 11/10/20  Yes [provider]  cloNIDine (CATAPRES) 0.2 MG tablet Take 0.2 mg by mouth 3 (three) times daily.   Yes [provider]  clopidogrel (PLAVIX) 75 MG tablet Take 75 mg by mouth at bedtime. 07/22/19  Yes [provider]  diclofenac (VOLTAREN) 75 MG EC tablet Take 75 mg by mouth 2 (two) times daily as needed (arthritis pain).   Yes [provider]  diphenhydramine-acetaminophen (TYLENOL PM) 25-500 MG TABS tablet Take 1 tablet by mouth at bedtime as needed (sleep).   Yes [provider]  estradiol (ESTRACE) 2 MG tablet Take 2 mg by mouth every morning.   Yes [provider]  ferrous sulfate 325 (65 FE) MG tablet Take 325 mg by mouth daily with lunch.   Yes [provider]  gabapentin (NEURONTIN) 600 MG tablet Take 300 mg by mouth at bedtime.   Yes [provider]  hydrALAZINE (APRESOLINE) 50 MG tablet Take 1 tablet (50 mg total) by mouth 3 (three) times daily as needed (for elevated BP). Patient taking differently: Take 50 mg by mouth 3 (three) times daily as needed (SBP >200). 11/10/20  Yes Wendall Stade, MD  hydrocortisone 2.5 % cream Apply 1 application topically 2 (two) times daily as needed (itching/rash).   Yes [provider]  levothyroxine (SYNTHROID) 25 MCG tablet Take 25 mcg by mouth daily before breakfast.   Yes [provider]  Multiple Vitamins-Minerals (PRESERVISION AREDS 2) CAPS Take 1 capsule by mouth every morning.   Yes [provider]  olmesartan (BENICAR) 40 MG tablet Take 40 mg by mouth every morning. 11/03/20  Yes [provider]  Omega-3 Fatty Acids (FISH OIL) 1000 MG CAPS Take 1,000 mg by mouth at bedtime.   Yes [provider]  pantoprazole (PROTONIX) 40 MG tablet Take 40 mg by mouth every morning.    Yes [provider]  SSD 1 % cream Apply 1 application topically daily. Apply to middle to on right foot 11/10/20  Yes [provider]  triamcinolone (KENALOG) 0.1 % Apply 1 application topically 2 (two) times daily as needed (itching/rash).   Yes [provider]  amLODipine (NORVASC) 10 MG tablet Take 10 mg by mouth daily. Patient not taking: No sig reported    [provider]  furosemide (LASIX) 20 MG tablet Take 1 tablet (20 mg total) by mouth daily. Can take an extra tablet by mouth daily as needed for edema. Patient not taking: No sig reported 07/06/20   Wendall Stade, MD  potassium chloride (MICRO-K) 10 MEQ CR capsule Take 1 capsule (10 mEq total) by mouth daily. Can take an extra tablet by mouth daily if  you are taking an extra furosemide for edema. Patient not taking: No sig reported 07/06/20   Wendall Stade, MD  valsartan (DIOVAN) 160 MG tablet Take 1 tablet (160 mg total) by mouth daily. Patient not taking: No sig reported 06/03/20   Wendall Stade, MD    Allergies    Statins  Review of Systems   Review of Systems  Constitutional:  Positive for chills and fatigue. Negative for fever.  HENT:  Negative for congestion, ear pain, rhinorrhea and sore throat.   Eyes:  Positive for photophobia. Negative for pain and redness.  Respiratory:  Negative for cough and shortness of breath.   Cardiovascular:  Negative for palpitations and leg swelling.  Gastrointestinal:  Negative for abdominal distention, abdominal pain, constipation, diarrhea, nausea and vomiting.  Genitourinary:  Negative for decreased urine volume, dysuria and hematuria.  Musculoskeletal:  Negative for back pain and neck stiffness.  Skin:  Negative for rash and wound.  Neurological:  Negative for seizures, syncope, numbness and headaches.  Psychiatric/Behavioral:  Positive for confusion. Negative for agitation.    Physical Exam Updated Vital Signs BP (!) 140/43   Pulse 85   Temp  (!) 97.2 F (36.2 C) (Rectal)   Resp 19   SpO2 96%   Physical Exam Vitals and nursing note reviewed.  Constitutional:      General: She is not in acute distress.    Appearance: She is ill-appearing. She is not toxic-appearing.  HENT:     Head: Normocephalic and atraumatic.     Right Ear: External ear normal.     Left Ear: External ear normal.     Nose: Nose normal. No congestion or rhinorrhea.     Mouth/Throat:     Mouth: Mucous membranes are moist.     Pharynx: Oropharynx is clear. No oropharyngeal exudate or posterior oropharyngeal erythema.  Eyes:     General:        Right eye: No discharge.        Left eye: No discharge.     Extraocular Movements: Extraocular movements intact.     Conjunctiva/sclera: Conjunctivae normal.     Pupils: Pupils are equal, round, and reactive to light.  Cardiovascular:     Rate and Rhythm: Normal rate and regular rhythm.     Heart sounds: No murmur heard.   No friction rub. No gallop.  Pulmonary:     Effort: Pulmonary effort is normal.     Breath sounds: No stridor. No wheezing, rhonchi or rales.  Chest:     Chest wall: No tenderness.  Abdominal:     General: Abdomen is flat. There is no distension.     Tenderness: There is abdominal tenderness (mild vague tenderness). There is no guarding or rebound.  Musculoskeletal:        General: No deformity or signs of injury.     Cervical back: Normal range of motion. No rigidity or tenderness.     Right lower leg: Edema present.     Left lower leg: Edema present.  Lymphadenopathy:     Cervical: No cervical adenopathy.  Skin:    General: Skin is warm and dry.  Neurological:     Mental Status: She is alert.     Cranial Nerves: No cranial nerve deficit.     Motor: No weakness.     Comments: Mildly disoriented    ED Results / Procedures / Treatments   Labs (all labs ordered are listed, but only abnormal results are displayed) Labs  Reviewed  CBC WITH DIFFERENTIAL/PLATELET - Abnormal; Notable  for the following components:      Result Value   WBC 11.4 (*)    RBC 3.45 (*)    Hemoglobin 11.3 (*)    HCT 33.7 (*)    Neutro Abs 10.0 (*)    All other components within normal limits  COMPREHENSIVE METABOLIC PANEL - Abnormal; Notable for the following components:   Sodium 131 (*)    Glucose, Bld 181 (*)    BUN 25 (*)    Creatinine, Ser 1.23 (*)    GFR, Estimated 41 (*)    All other components within normal limits  T4, FREE - Abnormal; Notable for the following components:   Free T4 1.13 (*)    All other components within normal limits  BRAIN NATRIURETIC PEPTIDE - Abnormal; Notable for the following components:   B Natriuretic Peptide 295.1 (*)    All other components within normal limits  CBG MONITORING, ED - Abnormal; Notable for the following components:   Glucose-Capillary 182 (*)    All other components within normal limits  TROPONIN I (HIGH SENSITIVITY) - Abnormal; Notable for the following components:   Troponin I (High Sensitivity) 189 (*)    All other components within normal limits  TROPONIN I (HIGH SENSITIVITY) - Abnormal; Notable for the following components:   Troponin I (High Sensitivity) 176 (*)    All other components within normal limits  RESP PANEL BY RT-PCR (FLU A&B, COVID) ARPGX2  URINE CULTURE  CULTURE, BLOOD (ROUTINE X 2)  CULTURE, BLOOD (ROUTINE X 2)  URINALYSIS, ROUTINE W REFLEX MICROSCOPIC  TSH  LACTIC ACID, PLASMA  LACTIC ACID, PLASMA  COMPREHENSIVE METABOLIC PANEL  CBC    EKG EKG Interpretation  Date/Time:  Saturday November 19 2020 14:40:34 EDT Ventricular Rate:  80 PR Interval:  170 QRS Duration: 98 QT Interval:  386 QTC Calculation: 445 R Axis:   35 Text Interpretation: Sinus rhythm with marked sinus arrhythmia Nonspecific ST abnormality Abnormal ECG No significant change since last tracing Confirmed by Richardean Canal [phone]) on 11/19/2020 5:22:54 PM  Radiology DG Chest 2 View  Result Date: 11/19/2020 CLINICAL DATA:  Altered mental  status EXAM: CHEST - 2 VIEW COMPARISON:  None. FINDINGS: The cardiomediastinal silhouette is unremarkable. There is no evidence of focal airspace disease, pulmonary edema, suspicious pulmonary nodule/mass, pleural effusion, or pneumothorax. No acute bony abnormalities are identified. Lumbar spine surgical hardware noted. IMPRESSION: No active cardiopulmonary disease. Electronically Signed   By: Harmon Pier M.D.   On: 11/19/2020 15:05   CT Head Wo Contrast  Result Date: 11/19/2020 CLINICAL DATA:  87 year old female with altered mental status. EXAM: CT HEAD WITHOUT CONTRAST TECHNIQUE: Contiguous axial images were obtained from the base of the skull through the vertex without intravenous contrast. COMPARISON:  None. FINDINGS: Brain: No evidence of acute infarction, hemorrhage, hydrocephalus, extra-axial collection or mass lesion/mass effect. Atrophy and chronic small-vessel white ischemic changes are noted. Vascular: Carotid atherosclerotic calcifications are noted. Skull: Normal. Negative for fracture or focal lesion. Sinuses/Orbits: No acute finding. Other: None. IMPRESSION: 1. No evidence of acute intracranial abnormality. 2. Atrophy and chronic small-vessel white ischemic changes. Electronically Signed   By: Harmon Pier M.D.   On: 11/19/2020 18:17    Procedures Procedures   Medications Ordered in ED Medications  cephALEXin (KEFLEX) capsule 500 mg (has no administration in time range)  levothyroxine (SYNTHROID) tablet 25 mcg (has no administration in time range)  pantoprazole (PROTONIX) EC tablet 40 mg (has  no administration in time range)  clopidogrel (PLAVIX) tablet 75 mg (75 mg Oral Given 11/20/20 0006)  ferrous sulfate tablet 325 mg (has no administration in time range)  gabapentin (NEURONTIN) capsule 300 mg (300 mg Oral Given 11/20/20 0006)  silver sulfADIAZINE (SILVADENE) 1 % cream 1 application (has no administration in time range)  enoxaparin (LOVENOX) injection 40 mg (has no administration in  time range)  sodium chloride flush (NS) 0.9 % injection 3 mL (has no administration in time range)  acetaminophen (TYLENOL) tablet 650 mg (has no administration in time range)    Or  acetaminophen (TYLENOL) suppository 650 mg (has no administration in time range)  polyethylene glycol (MIRALAX / GLYCOLAX) packet 17 g (has no administration in time range)  hydrALAZINE (APRESOLINE) injection 2 mg (has no administration in time range)  cloNIDine (CATAPRES) tablet 0.2 mg (0.2 mg Oral Given 11/20/20 0012)  diclofenac Sodium (VOLTAREN) 1 % topical gel 2 g (2 g Topical Given 11/19/20 1959)  hydrALAZINE (APRESOLINE) injection 5 mg (5 mg Intravenous Given 11/19/20 2101)  furosemide (LASIX) injection 40 mg (40 mg Intravenous Given 11/19/20 2100)    ED Course  I have reviewed the triage vital signs and the nursing notes.  Pertinent labs & imaging results that were available during my care of the patient were reviewed by me and considered in my medical decision making (see chart for details).    MDM Rules/Calculators/A&P                          This is a 89yoF w/ hx and pe as above.  Initial interventions: patient rewarmed w/ blankets and subsequently w/ bair hugger after core temperature 94.57F. Blood cultures and standard labs obtained for concern for cold sepsis. Voltaren gel for back pain, hydralazine prn for htn, lasix for fluid overload  Ddx included: sepsis, pna, uti, soft tissue infxn, metabolic derangement, acs, arrhythmia, chf exacerbation, ich, brain tumor, stroke  All studies independently reviewed by myself, d/w attending physician, factored into my mdm. -ekg: nsr 80bpm, normal axis, normal intervals, no acute ischemic changes, essentially unchanged compared to prior from 05/2020 -cbcd: wbc 11.4 -cmp: na+ 131, cr 1.23 -trop: 189->176 -unremarkable: ua, bnp (295.1, ~baseline), tsh, free t4, covid/flu pcr, cxr, cth noncon  Presentation appears most c/w hypertensive urgency/emergency w/  cardiac, renal, and cns manifestations. Possible component of sepsis though no clear source, cultures pending. Reassuring CXR and UA. No rash on exam. CMP w/o significant metabolic derangement. Mild troponinemia, likely 2/2 elevated pressures, no concerning EKG changes. BNP roughly baseline, unlikely CHF exacerbation. Reassuring cth w/o fnd.  Given recurrent hospitalizations and presentation c/w hypertensive urgency/emergency, feel pt requires admission. Internal medicine service agreed to admit. Family understands and agrees. Also discussed code status and started goals of care conversation w/ family. Bjorn LoserRhonda (daughter, POA) advised to keep full code for now, but will discuss further w/ family as the patient would most likely prefer DNR status.  Pt hds on reevaluation and subsequently transferred to IM service.  Final Clinical Impression(s) / ED Diagnoses Final diagnoses:  Hypertensive urgency    Rx / DC Orders ED Discharge Orders     None         Colvin Carolihandrasekar, Raneem [place], MD 11/20/20 0258    Charlynne PanderYao, David Hsienta, MD 11/20/20 [phone] [id number]

## 2020-11-19 NOTE — ED Notes (Signed)
RN notified Dr. Silverio Lay and Dr. Ronette Deter of pt critical troponin value of 189

## 2020-11-19 NOTE — H&P (Addendum)
History and Physical   Chelsea Moss ZOX:[id number] DOB: Sep 23, 1926 DOA: 11/19/2020  PCP: Rolm Gala, NP   Patient coming from: Home  Chief Complaint: Multiple complaints including elevated blood pressure, edema, confusion  HPI: Chelsea Moss is a 87 y.o. female with medical history significant of hypothyroidism, hypertension, CKD 3B, carotid artery disease status post endarterectomy, chronic back pain, heart failure with preserved ejection fraction, CVA, hyperlipidemia, anemia, macular degeneration, GERD who presents with confusion and elevated blood pressure and some edema. Patient has reportedly been having recurrent episodes for the past 6 months or so of elevated blood pressure, low sodium, hypothermia, hyperkalemia.  Most recent episode was last week and was at [facility] for 3 days.  Family has concerns that she has been living on her own and is has not been taking medications as prescribed leading to these recurrent episodes. She did recently have her hypertension regimen adjusted with olmesartan switch to valsartan which was subsequently discontinued.  And amlodipine was stopped in favor of as needed hydralazine.  Irving Burton states that she moved here from [place] about a year ago and has not been able to reestablish with all her specialist yet. Family and patient notes she has had some weight gain and edema could be related to her chronic heart failure. Blood pressure has been improved here but she still has some mild confusion.  She denies fevers, chills, chest pain, shortness of breath, abdominal pain, constipation, diarrhea, nausea, vomiting.  ED Course: Vital signs in the ED significant for blood pressure 150s to 190s systolic, temperature in the 93 to 95 degree range.  Vital signs otherwise stable.  Lab work-up showed CMP with sodium 131 which corrects to 133 considering glucose of 181, creatinine stable at 1.23.  CBC with leukocytosis to 11.4 and hemoglobin 11.3.  TSH normal  with upper limit of normal T4.  BNP mildly elevated to 95, troponin mildly elevated at 181 downtrending on repeat to 176.  Lactic acid normal.  Respiratory panel negative for flu and COVID.  Urinalysis normal.  Urine culture blood cultures pending.  Chest x-ray and CT head without acute abnormality.  Patient received dose of hydralazine and Lasix in the ED.  Review of Systems: As per HPI otherwise all other systems reviewed and are negative.  Past Medical History:  Diagnosis Date   Anemia    Carotid artery stenosis    CHF (congestive heart failure) (HCC)    CKD (chronic kidney disease)    Diastolic heart failure with preserved ejection fraction (HCC)    Gait disturbance    High blood pressure    High cholesterol    History of CEA (carotid endarterectomy)    Hypothyroidism    Iron deficiency    Lacunar infarction (HCC)    Low back pain without sciatica    Muscular degeneration    Osteopenia    Skin lesion    Statin intolerance    Stroke (cerebrum) (HCC)    Tibialis posterior tendinitis     Past Surgical History:  Procedure Laterality Date   ABDOMINAL HYSTERECTOMY     BACK SURGERY     CARPAL TUNNEL RELEASE     CATARACT EXTRACTION     EXTERNAL EAR SURGERY     KIDNEY STONE SURGERY     LUMBAR SPINE SURGERY      Social History  reports that she quit smoking about 26 years ago. Her smoking use included cigarettes. She has never used smokeless tobacco. She reports current alcohol use. She reports that  she does not use drugs.  Allergies  Allergen Reactions   Statins Other (See Comments)    musculoskeletal aches and pains, can't take    Family History  Problem Relation Age of Onset   Heart disease Mother    Emphysema Father   Acute on admission her  Prior to Admission medications   Medication Sig Start Date End Date Taking? Authorizing Provider  acetaminophen (TYLENOL) 500 MG tablet Take 1,000 mg by mouth at bedtime.   Yes [provider]  BIOTIN PO Take 1  tablet by mouth daily with lunch.   Yes [provider]  busPIRone (BUSPAR) 5 MG tablet Take 5 mg by mouth 2 (two) times daily. 11/14/20  Yes [provider]  Calcium Carbonate-Vitamin D (CALCIUM-D PO) Take 1 tablet by mouth See admin instructions. 1 tablet by mouth daily with lunch and at bedtime   Yes [provider]  cephALEXin (KEFLEX) 500 MG capsule Take 500 mg by mouth 2 (two) times daily. 11/10/20  Yes [provider]  cloNIDine (CATAPRES) 0.2 MG tablet Take 0.2 mg by mouth 3 (three) times daily.   Yes [provider]  clopidogrel (PLAVIX) 75 MG tablet Take 75 mg by mouth at bedtime. 07/22/19  Yes [provider]  diclofenac (VOLTAREN) 75 MG EC tablet Take 75 mg by mouth 2 (two) times daily as needed (arthritis pain).   Yes [provider]  diphenhydramine-acetaminophen (TYLENOL PM) 25-500 MG TABS tablet Take 1 tablet by mouth at bedtime as needed (sleep).   Yes [provider]  estradiol (ESTRACE) 2 MG tablet Take 2 mg by mouth every morning.   Yes [provider]  ferrous sulfate 325 (65 FE) MG tablet Take 325 mg by mouth daily with lunch.   Yes [provider]  gabapentin (NEURONTIN) 600 MG tablet Take 300 mg by mouth at bedtime.   Yes [provider]  hydrALAZINE (APRESOLINE) 50 MG tablet Take 1 tablet (50 mg total) by mouth 3 (three) times daily as needed (for elevated BP). Patient taking differently: Take 50 mg by mouth 3 (three) times daily as needed (SBP >200). 11/10/20  Yes Wendall Stade, MD  hydrocortisone 2.5 % cream Apply 1 application topically 2 (two) times daily as needed (itching/rash).   Yes [provider]  levothyroxine (SYNTHROID) 25 MCG tablet Take 25 mcg by mouth daily before breakfast.   Yes [provider]  Multiple Vitamins-Minerals (PRESERVISION AREDS 2) CAPS Take 1 capsule by mouth every morning.   Yes [provider]  olmesartan (BENICAR) 40 MG  tablet Take 40 mg by mouth every morning. 11/03/20  Yes [provider]  Omega-3 Fatty Acids (FISH OIL) 1000 MG CAPS Take 1,000 mg by mouth at bedtime.   Yes [provider]  pantoprazole (PROTONIX) 40 MG tablet Take 40 mg by mouth every morning.   Yes [provider]  SSD 1 % cream Apply 1 application topically daily. Apply to middle to on right foot 11/10/20  Yes [provider]  triamcinolone (KENALOG) 0.1 % Apply 1 application topically 2 (two) times daily as needed (itching/rash).   Yes [provider]  amLODipine (NORVASC) 10 MG tablet Take 10 mg by mouth daily. Patient not taking: No sig reported    [provider]  furosemide (LASIX) 20 MG tablet Take 1 tablet (20 mg total) by mouth daily. Can take an extra tablet by mouth daily as needed for edema. Patient not taking: No sig reported 07/06/20  Wendall Stade, MD  potassium chloride (MICRO-K) 10 MEQ CR capsule Take 1 capsule (10 mEq total) by mouth daily. Can take an extra tablet by mouth daily if you are taking an extra furosemide for edema. Patient not taking: No sig reported 07/06/20   Wendall Stade, MD  valsartan (DIOVAN) 160 MG tablet Take 1 tablet (160 mg total) by mouth daily. Patient not taking: No sig reported 06/03/20   Wendall Stade, MD    Physical Exam: Vitals:   11/19/20 2101 11/19/20 2145 11/19/20 2200 11/19/20 2230  BP: (!) 198/65 (!) 191/77 (!) 160/71 (!) 157/46  Pulse:  98 (!) 47 87  Resp:  (!) 22 10 15   Temp:      TempSrc:      SpO2:  97% 96% 97%   Physical Exam Constitutional:      General: She is not in acute distress.    Appearance: Normal appearance.  HENT:     Head: Normocephalic and atraumatic.     Mouth/Throat:     Mouth: Mucous membranes are moist.     Pharynx: Oropharynx is clear.  Eyes:     Extraocular Movements: Extraocular movements intact.     Pupils: Pupils are equal, round, and reactive to light.  Cardiovascular:     Rate and Rhythm:  Normal rate and regular rhythm.     Pulses: Normal pulses.     Heart sounds: Normal heart sounds.  Pulmonary:     Effort: Pulmonary effort is normal. No respiratory distress.     Breath sounds: Normal breath sounds.  Abdominal:     General: Bowel sounds are normal. There is no distension.     Palpations: Abdomen is soft.     Tenderness: There is no abdominal tenderness.  Musculoskeletal:        General: No swelling or deformity.     Right lower leg: Edema (Trace) present.     Left lower leg: Edema (Trace) present.  Skin:    General: Skin is warm and dry.  Neurological:     Comments: Mild confusion with disorientation to year and at times place.  Moves all 4 extremities spontaneously against gravity.   Labs on Admission: I have personally reviewed following labs and imaging studies  CBC: Recent Labs  Lab 11/19/20 1442  WBC 11.4*  NEUTROABS 10.0*  HGB 11.3*  HCT 33.7*  MCV 97.7  PLT 369    Basic Metabolic Panel: Recent Labs  Lab 11/19/20 1442  NA 131*  K 4.2  CL 98  CO2 22  GLUCOSE 181*  BUN 25*  CREATININE 1.23*  CALCIUM 9.8    GFR: CrCl cannot be calculated (Unknown ideal weight.).  Liver Function Tests: Recent Labs  Lab 11/19/20 1442  AST 27  ALT 18  ALKPHOS 55  BILITOT 0.5  PROT 6.7  ALBUMIN 3.7    Urine analysis:    Component Value Date/Time   COLORURINE YELLOW 11/19/2020 1442   APPEARANCEUR CLEAR 11/19/2020 1442   LABSPEC 1.010 11/19/2020 1442   PHURINE 6.0 11/19/2020 1442   GLUCOSEU NEGATIVE 11/19/2020 1442   HGBUR NEGATIVE 11/19/2020 1442   BILIRUBINUR NEGATIVE 11/19/2020 1442   KETONESUR NEGATIVE 11/19/2020 1442   PROTEINUR NEGATIVE 11/19/2020 1442   NITRITE NEGATIVE 11/19/2020 1442   LEUKOCYTESUR NEGATIVE 11/19/2020 1442    Radiological Exams on Admission: DG Chest 2 View  Result Date: 11/19/2020 CLINICAL DATA:  Altered mental status EXAM: CHEST - 2 VIEW COMPARISON:  None. FINDINGS: The cardiomediastinal silhouette is  unremarkable. There is no evidence of focal airspace disease, pulmonary edema, suspicious pulmonary nodule/mass, pleural effusion, or pneumothorax. No acute bony abnormalities are identified. Lumbar spine surgical hardware noted. IMPRESSION: No active cardiopulmonary disease. Electronically Signed   By: Harmon PierJeffrey  Hu M.D.   On: 11/19/2020 15:05   CT Head Wo Contrast  Result Date: 11/19/2020 CLINICAL DATA:  87 year old female with altered mental status. EXAM: CT HEAD WITHOUT CONTRAST TECHNIQUE: Contiguous axial images were obtained from the base of the skull through the vertex without intravenous contrast. COMPARISON:  None. FINDINGS: Brain: No evidence of acute infarction, hemorrhage, hydrocephalus, extra-axial collection or mass lesion/mass effect. Atrophy and chronic small-vessel white ischemic changes are noted. Vascular: Carotid atherosclerotic calcifications are noted. Skull: Normal. Negative for fracture or focal lesion. Sinuses/Orbits: No acute finding. Other: None. IMPRESSION: 1. No evidence of acute intracranial abnormality. 2. Atrophy and chronic small-vessel white ischemic changes. Electronically Signed   By: Harmon PierJeffrey  Hu M.D.   On: 11/19/2020 18:17    EKG: Independently reviewed.  Sinus rhythm at 80 bpm.  Sinus arrhythmia noted versus possible PACs.  Nonspecific T wave inversions in lead III.  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  Has had some confusion and has an elevated troponin to 189 and BNP to 295. > Patient also with some weight  gain of about 6 pounds and some edema.  History of heart failure with preserved ejection fraction on Lasix but no echo in chart. > Noted to have BNP of 295 and troponin of 189 which improved to 176 on repeat.  Concern for hypertensive emergency with possible heart failure exacerbation.  Has had multiple medication changes recently currently on clonidine 3 times daily and hydralazine as needed only. > Received hydralazine and a dose of Lasix in the ED > Patient also noted to have some hypothermia and mild confusion, this has been recurrent for her in the past 6 months. - Monitor in progressive unit - Monitor response to initial dose of Lasix - Continue with home clonidine  - As needed hydralazine - Daily weights, I's/O - Hold off on echocardiogram for now.  They had one a few days ago during previous hospitalization which was reportedly normal however we do not have access to the [facility] records. -We will order case management consult to help with arranging outpatient providers.  Confusion Hypothermia > Mild confusion with disorientation to year otherwise alert and oriented > Has had recurrent episodes of hypothermia, we will continue with bear hugger and warm blankets for now - Continue to monitor  Leukocytosis > Chest x-ray without acute abnormality and UA without abnormality as well. > Possibly reactive in the setting of hypertensive urgency/emergency. However is currently on Keflex for toe ulceration (this was noted in chart after patient was examined I have not seen the toe). - Trend CBC, Continue keflex  Fe Deficiency anemia > Hemoglobin 11.3 in the ED - Continue iron  Hypothyroidism - Continue home Synthroid  CKD 3B Hyponatremia > Creatinine stable at 1.23.  Mild hyponatremia at 1 32-1 33 when corrected for glucose of 181 -  Avoid nephrotoxic agents - Trend renal function electrolytes  History of CVA Hyperlipidemia > Also has history of statin intolerance - Continue home  Plavix  GERD - Continue PPI  DVT prophylaxis: Lovenox  Code Status:   DNR, discussed with patient and daughter at bedside. Family Communication:  Daughter updated at bedside Disposition Plan:   Patient is from:  Home  Anticipated DC to:  Pending clinical course  Anticipated DC date:  1 to 3 days  Anticipated DC barriers: None  Consults called:  None Admission status:  Observation, progressive  Severity of Illness: The appropriate patient status for this patient is OBSERVATION. Observation status is judged to be reasonable and necessary in order to provide the required intensity of service to ensure the patient's safety. The patient's presenting symptoms, physical exam findings, and initial radiographic and laboratory data in the context of their medical condition is felt to place them at decreased risk for further clinical deterioration. Furthermore, it is anticipated that the patient will be medically stable for discharge from the hospital within 2 midnights of admission. The following factors support the patient status of observation.   " The patient's presenting symptoms include confusion, weight gain, edema, elevated blood pressure. " The physical exam findings include trace edema, mild confusion. " The initial radiographic and laboratory data are sodium 1 32-1 33, white count 11.4, hemoglobin 11.3.  BNP 295, troponin 189 and then 176 on repeat.  Normal urinalysis and chest x-ray.   Synetta Fail MD [facility]  How to contact the [facility] Attending or Consulting provider 7A - 7P or covering provider during after hours 7P -7A, for this patient?   Check the care team in [facility] and look for a) attending/consulting [facility] provider listed and b) the [facility] team listed Log into [email or web address] and use [place]'s universal password to access. If you do not have the password, please contact the hospital operator. Locate the [facility] provider you are looking for under [facility] and page  to a number that you can be directly reached. If you still have difficulty reaching the provider, please page the [facility] (Director on Call) for the Hospitalists listed on amion for assistance.  11/19/2020, 11:26 PM

## 2020-11-19 NOTE — ED Notes (Signed)
Per MD, bair hugger applied to pt. Warm blankets layered on top.

## 2020-11-19 NOTE — ED Triage Notes (Signed)
Pt to triage via [facility] from home for disorientation.  Reports similar episode a few weeks ago and seen at [facility] ED for hyponatremia.  Family reports pt's temperature has been low.  Pt reports neck pain.  Denies injury.  Oriented to person, place, and current holiday but disoriented to year.

## 2020-11-19 NOTE — ED Notes (Signed)
MD aware of temp increase

## 2020-11-20 ENCOUNTER — Inpatient Hospital Stay (HOSPITAL_COMMUNITY): Payer: Medicare Other

## 2020-11-20 DIAGNOSIS — E7800 Pure hypercholesterolemia, unspecified: Secondary | ICD-10-CM | POA: Diagnosis present

## 2020-11-20 DIAGNOSIS — Z8249 Family history of ischemic heart disease and other diseases of the circulatory system: Secondary | ICD-10-CM | POA: Diagnosis not present

## 2020-11-20 DIAGNOSIS — I671 Cerebral aneurysm, nonruptured: Secondary | ICD-10-CM | POA: Diagnosis present

## 2020-11-20 DIAGNOSIS — R404 Transient alteration of awareness: Secondary | ICD-10-CM | POA: Diagnosis not present

## 2020-11-20 DIAGNOSIS — N1832 Chronic kidney disease, stage 3b: Secondary | ICD-10-CM | POA: Diagnosis present

## 2020-11-20 DIAGNOSIS — Z87891 Personal history of nicotine dependence: Secondary | ICD-10-CM | POA: Diagnosis not present

## 2020-11-20 DIAGNOSIS — Z79899 Other long term (current) drug therapy: Secondary | ICD-10-CM | POA: Diagnosis not present

## 2020-11-20 DIAGNOSIS — G8929 Other chronic pain: Secondary | ICD-10-CM | POA: Diagnosis present

## 2020-11-20 DIAGNOSIS — E039 Hypothyroidism, unspecified: Secondary | ICD-10-CM | POA: Diagnosis present

## 2020-11-20 DIAGNOSIS — K219 Gastro-esophageal reflux disease without esophagitis: Secondary | ICD-10-CM | POA: Diagnosis present

## 2020-11-20 DIAGNOSIS — I651 Occlusion and stenosis of basilar artery: Secondary | ICD-10-CM | POA: Diagnosis not present

## 2020-11-20 DIAGNOSIS — Z20822 Contact with and (suspected) exposure to covid-19: Secondary | ICD-10-CM | POA: Diagnosis present

## 2020-11-20 DIAGNOSIS — Z8673 Personal history of transient ischemic attack (TIA), and cerebral infarction without residual deficits: Secondary | ICD-10-CM | POA: Diagnosis not present

## 2020-11-20 DIAGNOSIS — I5032 Chronic diastolic (congestive) heart failure: Secondary | ICD-10-CM

## 2020-11-20 DIAGNOSIS — Z7902 Long term (current) use of antithrombotics/antiplatelets: Secondary | ICD-10-CM | POA: Diagnosis not present

## 2020-11-20 DIAGNOSIS — N179 Acute kidney failure, unspecified: Secondary | ICD-10-CM | POA: Diagnosis present

## 2020-11-20 DIAGNOSIS — G9341 Metabolic encephalopathy: Secondary | ICD-10-CM | POA: Diagnosis present

## 2020-11-20 DIAGNOSIS — Z66 Do not resuscitate: Secondary | ICD-10-CM | POA: Diagnosis present

## 2020-11-20 DIAGNOSIS — Z90710 Acquired absence of both cervix and uterus: Secondary | ICD-10-CM | POA: Diagnosis not present

## 2020-11-20 DIAGNOSIS — I161 Hypertensive emergency: Secondary | ICD-10-CM | POA: Diagnosis present

## 2020-11-20 DIAGNOSIS — Z79890 Hormone replacement therapy (postmenopausal): Secondary | ICD-10-CM | POA: Diagnosis not present

## 2020-11-20 DIAGNOSIS — E871 Hypo-osmolality and hyponatremia: Secondary | ICD-10-CM | POA: Diagnosis present

## 2020-11-20 DIAGNOSIS — I2510 Atherosclerotic heart disease of native coronary artery without angina pectoris: Secondary | ICD-10-CM | POA: Diagnosis present

## 2020-11-20 DIAGNOSIS — D509 Iron deficiency anemia, unspecified: Secondary | ICD-10-CM | POA: Diagnosis present

## 2020-11-20 DIAGNOSIS — D72829 Elevated white blood cell count, unspecified: Secondary | ICD-10-CM | POA: Diagnosis present

## 2020-11-20 DIAGNOSIS — I160 Hypertensive urgency: Secondary | ICD-10-CM | POA: Diagnosis present

## 2020-11-20 DIAGNOSIS — I130 Hypertensive heart and chronic kidney disease with heart failure and stage 1 through stage 4 chronic kidney disease, or unspecified chronic kidney disease: Secondary | ICD-10-CM | POA: Diagnosis present

## 2020-11-20 LAB — CBC
HCT: 25.5 % — ABNORMAL LOW (ref 36.0–46.0)
Hemoglobin: 8.9 g/dL — ABNORMAL LOW (ref 12.0–15.0)
MCH: 34 pg (ref 26.0–34.0)
MCHC: 34.9 g/dL (ref 30.0–36.0)
MCV: 97.3 fL (ref 80.0–100.0)
Platelets: 269 10*3/uL (ref 150–400)
RBC: 2.62 MIL/uL — ABNORMAL LOW (ref 3.87–5.11)
RDW: 12.9 % (ref 11.5–15.5)
WBC: 11.5 10*3/uL — ABNORMAL HIGH (ref 4.0–10.5)
nRBC: 0 % (ref 0.0–0.2)

## 2020-11-20 LAB — COMPREHENSIVE METABOLIC PANEL
ALT: 15 U/L (ref 0–44)
AST: 25 U/L (ref 15–41)
Albumin: 2.9 g/dL — ABNORMAL LOW (ref 3.5–5.0)
Alkaline Phosphatase: 38 U/L (ref 38–126)
Anion gap: 9 (ref 5–15)
BUN: 22 mg/dL (ref 8–23)
CO2: 26 mmol/L (ref 22–32)
Calcium: 9.1 mg/dL (ref 8.9–10.3)
Chloride: 97 mmol/L — ABNORMAL LOW (ref 98–111)
Creatinine, Ser: 1.32 mg/dL — ABNORMAL HIGH (ref 0.44–1.00)
GFR, Estimated: 38 mL/min — ABNORMAL LOW (ref >60)
Glucose, Bld: 103 mg/dL — ABNORMAL HIGH (ref 70–99)
Potassium: 4.3 mmol/L (ref 3.5–5.1)
Sodium: 132 mmol/L — ABNORMAL LOW (ref 135–145)
Total Bilirubin: 0.8 mg/dL (ref 0.3–1.2)
Total Protein: 5.2 g/dL — ABNORMAL LOW (ref 6.5–8.1)

## 2020-11-20 LAB — ECHOCARDIOGRAM COMPLETE
Area-P 1/2: 3.6 cm2
S' Lateral: 2.7 cm

## 2020-11-20 MED ORDER — LISINOPRIL 5 MG PO TABS: 5.0000 mg | ORAL_TABLET | Freq: Every day | ORAL | Status: DC

## 2020-11-20 MED ORDER — IRBESARTAN 75 MG PO TABS
37.5000 mg | ORAL_TABLET | Freq: Every day | ORAL | Status: DC
  Administered 2020-11-20: 37.5 mg via ORAL
  Filled 2020-11-20 (×2): qty 0.5

## 2020-11-20 MED ORDER — FUROSEMIDE 10 MG/ML IJ SOLN
20.0000 mg | Freq: Two times a day (BID) | INTRAMUSCULAR | Status: DC
  Administered 2020-11-20 (×2): 20 mg via INTRAVENOUS
  Filled 2020-11-20 (×2): qty 2

## 2020-11-20 MED ORDER — CEPHALEXIN 250 MG PO CAPS
500.0000 mg | ORAL_CAPSULE | Freq: Two times a day (BID) | ORAL | Status: AC
  Administered 2020-11-20 – 2020-11-21 (×4): 500 mg via ORAL
  Filled 2020-11-20 (×4): qty 2

## 2020-11-20 MED ORDER — CARVEDILOL 3.125 MG PO TABS
3.1250 mg | ORAL_TABLET | Freq: Two times a day (BID) | ORAL | Status: DC
  Administered 2020-11-20: 3.125 mg via ORAL
  Filled 2020-11-20: qty 1

## 2020-11-20 MED ORDER — POLYETHYLENE GLYCOL 3350 17 G PO PACK
17.0000 g | PACK | Freq: Every day | ORAL | Status: AC
  Administered 2020-11-20 – 2020-11-21 (×2): 17 g via ORAL
  Filled 2020-11-20 (×2): qty 1

## 2020-11-20 MED ORDER — FUROSEMIDE 10 MG/ML IJ SOLN: 40.0000 mg | Freq: Two times a day (BID) | INTRAMUSCULAR | Status: DC

## 2020-11-20 MED ORDER — HYDRALAZINE HCL 25 MG PO TABS: 50.0000 mg | ORAL_TABLET | Freq: Three times a day (TID) | ORAL | Status: DC | PRN

## 2020-11-20 NOTE — ED Notes (Signed)
Ordered Breakfast 

## 2020-11-20 NOTE — Progress Notes (Signed)
  Echocardiogram 2D Echocardiogram has been performed.  Chelsea Moss 11/20/2020, 11:36 AM

## 2020-11-20 NOTE — Plan of Care (Signed)

## 2020-11-20 NOTE — ED Notes (Signed)
Pt moved from stretcher onto hospital bed

## 2020-11-20 NOTE — ED Notes (Signed)
Report given to Marlowe Sax, RN of [phone]

## 2020-11-20 NOTE — Progress Notes (Signed)
[facility] PROGRESS NOTE    Progress Note  Chelsea Moss  AVW:[id number] DOB: 13-Mar-1927 DOA: 11/19/2020 PCP: Rolm GalaHeyn, Sharon, NP     Brief Narrative:   Chelsea Moss is an 87 y.o. female past medical history significant for hypothyroidism, essential hypertension, chronic kidney disease stage IIIb carotid artery disease status post endarterectomy chronic diastolic heart failure who presents to the ED with confusion elevated blood pressure and edema in the ED she was found to have a blood pressure systolic of 190 sodium 131, creatinine 1.3, mild leukocytosis with left shift lactic acid 1.1   Assessment/Plan:   Hypertensive emergency: Question likely due to noncompliance. She related a blood pressure of systolic in the 200s at home, here in the ED 190s, BNP mildly elevated at 295 she denies any chest pain. 2D echo done in 2019 showed an EF of 75% no wall motion abnormalities, valves were unremarkable. Check a 2D echo. Discontinue clonidine, continue hydralazine orally, will add low-dose Coreg and lisinopril, hydralazine IV as needed we will try to lower the blood pressure around 25%.  Acute metabolic encephalopathy: Has had recurrent hypothermia we will do a LawyerBair hugger.  Leukocytosis: Question reactive in the setting of hypertensive urgency.  Iron deficiency anemia: Continue to monitor hemoglobin.  Hypothyroidism: Continue Synthroid.  Chronic kidney disease stage IIIb: Going back through his record his creatinine appears to be at baseline which is around 1.3.  History of CVA: Continue statin and Plavix.    DVT prophylaxis: lovenoxn Family Communication:none Status is: Observation  The patient will require care spanning > 2 midnights and should be moved to inpatient because: Hemodynamically unstable  Dispo: The patient is from: Home              Anticipated d/c is to: Home              Patient currently is not medically stable to d/c.   Difficult to place  patient No        Code Status:     Code Status Orders  (From admission, onward)           Start     Ordered   11/19/20 2357  Do not attempt resuscitation (DNR)  Continuous       Question Answer Comment  In the event of cardiac or respiratory ARREST Do not call a "code blue"   In the event of cardiac or respiratory ARREST Do not perform Intubation, CPR, defibrillation or ACLS   In the event of cardiac or respiratory ARREST Use medication by any route, position, wound care, and other measures to relive pain and suffering. May use oxygen, suction and manual treatment of airway obstruction as needed for comfort.      11/19/20 2357           Code Status History     Date Active Date Inactive Code Status Order ID Comments User Context   11/19/2020 2308 11/19/2020 2357 Full Code [id number]  Synetta FailMelvin, Alexander B, MD ED   11/19/2020 2202 11/19/2020 2308 Full Code [id number]  Colvin Carolihandrasekar, Additya, MD ED         IV Access:   Peripheral IV   Procedures and diagnostic studies:   DG Chest 2 View  Result Date: 11/19/2020 CLINICAL DATA:  Altered mental status EXAM: CHEST - 2 VIEW COMPARISON:  None. FINDINGS: The cardiomediastinal silhouette is unremarkable. There is no evidence of focal airspace disease, pulmonary edema, suspicious pulmonary nodule/mass, pleural effusion, or pneumothorax. No acute bony abnormalities are identified.  Lumbar spine surgical hardware noted. IMPRESSION: No active cardiopulmonary disease. Electronically Signed   By: Harmon Pier M.D.   On: 11/19/2020 15:05   CT Head Wo Contrast  Result Date: 11/19/2020 CLINICAL DATA:  87 year old female with altered mental status. EXAM: CT HEAD WITHOUT CONTRAST TECHNIQUE: Contiguous axial images were obtained from the base of the skull through the vertex without intravenous contrast. COMPARISON:  None. FINDINGS: Brain: No evidence of acute infarction, hemorrhage, hydrocephalus, extra-axial collection or mass lesion/mass effect.  Atrophy and chronic small-vessel white ischemic changes are noted. Vascular: Carotid atherosclerotic calcifications are noted. Skull: Normal. Negative for fracture or focal lesion. Sinuses/Orbits: No acute finding. Other: None. IMPRESSION: 1. No evidence of acute intracranial abnormality. 2. Atrophy and chronic small-vessel white ischemic changes. Electronically Signed   By: Harmon Pier M.D.   On: 11/19/2020 18:17     Medical Consultants:   None.   Subjective:    Chelsea Moss she relates she feels better than when she came in.  Objective:    Vitals:   11/20/20 0600 11/20/20 0700 11/20/20 0715 11/20/20 0730  BP: (!) 109/58 (!) 111/46 (!) 114/41 (!) 144/48  Pulse: (!) 51 (!) 46 (!) 56 (!) 55  Resp: 14 16 13 18   Temp:      TempSrc:      SpO2: 96% 96% 98% 97%   SpO2: 97 %  No intake or output data in the 24 hours ending 11/20/20 0829 There were no vitals filed for this visit.  Exam: General exam: In no acute distress. Respiratory system: Good air movement and clear to auscultation. Cardiovascular system: S1 & S2 heard, RRR. No JVD. Gastrointestinal system: Abdomen is nondistended, soft and nontender.  Extremities: No pedal edema. Skin: No rashes, lesions or ulcers Data Reviewed:    Labs: Basic Metabolic Panel: Recent Labs  Lab 11/19/20 1442 11/20/20 0340  NA 131* 132*  K 4.2 4.3  CL 98 97*  CO2 22 26  GLUCOSE 181* 103*  BUN 25* 22  CREATININE 1.23* 1.32*  CALCIUM 9.8 9.1   GFR CrCl cannot be calculated (Unknown ideal weight.). Liver Function Tests: Recent Labs  Lab 11/19/20 1442 11/20/20 0340  AST 27 25  ALT 18 15  ALKPHOS 55 38  BILITOT 0.5 0.8  PROT 6.7 5.2*  ALBUMIN 3.7 2.9*   No results for input(s): LIPASE, AMYLASE in the last 168 hours. No results for input(s): AMMONIA in the last 168 hours. Coagulation profile No results for input(s): INR, PROTIME in the last 168 hours. COVID-19 Labs  No results for input(s): DDIMER, FERRITIN, LDH, CRP  in the last 72 hours.  Lab Results  Component Value Date   SARSCOV2NAA NEGATIVE 11/19/2020    CBC: Recent Labs  Lab 11/19/20 1442 11/20/20 0340  WBC 11.4* 11.5*  NEUTROABS 10.0*  --   HGB 11.3* 8.9*  HCT 33.7* 25.5*  MCV 97.7 97.3  PLT 369 269   Cardiac Enzymes: No results for input(s): CKTOTAL, CKMB, CKMBINDEX, TROPONINI in the last 168 hours. BNP (last 3 results) No results for input(s): PROBNP in the last 8760 hours. CBG: Recent Labs  Lab 11/19/20 1439  GLUCAP 182*   D-Dimer: No results for input(s): DDIMER in the last 72 hours. Hgb A1c: No results for input(s): HGBA1C in the last 72 hours. Lipid Profile: No results for input(s): CHOL, HDL, LDLCALC, TRIG, CHOLHDL, LDLDIRECT in the last 72 hours. Thyroid function studies: Recent Labs    11/19/20 1729  TSH 3.590   Anemia work up:  No results for input(s): VITAMINB12, FOLATE, FERRITIN, TIBC, IRON, RETICCTPCT in the last 72 hours. Sepsis Labs: Recent Labs  Lab 11/19/20 1442 11/19/20 1729 11/19/20 1929 11/20/20 0340  WBC 11.4*  --   --  11.5*  LATICACIDVEN  --  1.4 1.1  --    Microbiology Recent Results (from the past 240 hour(s))  Resp Panel by RT-PCR (Flu A&B, Covid) Nasopharyngeal Swab     Status: None   Collection Time: 11/19/20  6:26 PM   Specimen: Nasopharyngeal Swab; Nasopharyngeal(NP) swabs in vial transport medium  Result Value Ref Range Status   SARS Coronavirus 2 by RT PCR NEGATIVE NEGATIVE Final    Comment: (NOTE) SARS-CoV-2 target nucleic acids are NOT DETECTED.  The SARS-CoV-2 RNA is generally detectable in upper respiratory specimens during the acute phase of infection. The lowest concentration of SARS-CoV-2 viral copies this assay can detect is 138 copies/mL. A negative result does not preclude SARS-Cov-2 infection and should not be used as the sole basis for treatment or other patient management decisions. A negative result may occur with  improper specimen collection/handling,  submission of specimen other than nasopharyngeal swab, presence of viral mutation(s) within the areas targeted by this assay, and inadequate number of viral copies(<138 copies/mL). A negative result must be combined with clinical observations, patient history, and epidemiological information. The expected result is Negative.  Fact Sheet for Patients:  BloggerCourse.com  Fact Sheet for Healthcare Providers:  SeriousBroker.it  This test is no t yet approved or cleared by the [place] FDA and  has been authorized for detection and/or diagnosis of SARS-CoV-2 by FDA under an Emergency Use Authorization (EUA). This EUA will remain  in effect (meaning this test can be used) for the duration of the COVID-19 declaration under Section 564(b)(1) of the Act, 21 U.S.C.section 360bbb-3(b)(1), unless the authorization is terminated  or revoked sooner.       Influenza A by PCR NEGATIVE NEGATIVE Final   Influenza B by PCR NEGATIVE NEGATIVE Final    Comment: (NOTE) The Xpert Xpress SARS-CoV-2/FLU/RSV plus assay is intended as an aid in the diagnosis of influenza from Nasopharyngeal swab specimens and should not be used as a sole basis for treatment. Nasal washings and aspirates are unacceptable for Xpert Xpress SARS-CoV-2/FLU/RSV testing.  Fact Sheet for Patients: BloggerCourse.com  Fact Sheet for Healthcare Providers: SeriousBroker.it  This test is not yet approved or cleared by the [place] FDA and has been authorized for detection and/or diagnosis of SARS-CoV-2 by FDA under an Emergency Use Authorization (EUA). This EUA will remain in effect (meaning this test can be used) for the duration of the COVID-19 declaration under Section 564(b)(1) of the Act, 21 U.S.C. section 360bbb-3(b)(1), unless the authorization is terminated or revoked.  Performed at [facility], [address], [place], [place] [address]      Medications:    cephALEXin  500 mg Oral BID   cloNIDine  0.2 mg Oral TID   clopidogrel  75 mg Oral QHS   enoxaparin (LOVENOX) injection  40 mg Subcutaneous Daily   ferrous sulfate  325 mg Oral Q lunch   gabapentin  300 mg Oral QHS   levothyroxine  25 mcg Oral QAC breakfast   pantoprazole  40 mg Oral q morning   silver sulfADIAZINE  1 application Topical Daily   sodium chloride flush  3 mL Intravenous Q12H   Continuous Infusions:    LOS: 0 days   Marinda Elk  [facility]  11/20/2020, 8:29 AM

## 2020-11-21 LAB — CBC WITH DIFFERENTIAL/PLATELET
Abs Immature Granulocytes: 0.02 10*3/uL (ref 0.00–0.07)
Basophils Absolute: 0 10*3/uL (ref 0.0–0.1)
Basophils Relative: 0 %
Eosinophils Absolute: 0.2 10*3/uL (ref 0.0–0.5)
Eosinophils Relative: 2 %
HCT: 29 % — ABNORMAL LOW (ref 36.0–46.0)
Hemoglobin: 9.8 g/dL — ABNORMAL LOW (ref 12.0–15.0)
Immature Granulocytes: 0 %
Lymphocytes Relative: 12 %
Lymphs Abs: 0.9 10*3/uL (ref 0.7–4.0)
MCH: 33.2 pg (ref 26.0–34.0)
MCHC: 33.8 g/dL (ref 30.0–36.0)
MCV: 98.3 fL (ref 80.0–100.0)
Monocytes Absolute: 0.9 10*3/uL (ref 0.1–1.0)
Monocytes Relative: 12 %
Neutro Abs: 5.7 10*3/uL (ref 1.7–7.7)
Neutrophils Relative %: 74 %
Platelets: 285 10*3/uL (ref 150–400)
RBC: 2.95 MIL/uL — ABNORMAL LOW (ref 3.87–5.11)
RDW: 13.1 % (ref 11.5–15.5)
WBC: 7.7 10*3/uL (ref 4.0–10.5)
nRBC: 0 % (ref 0.0–0.2)

## 2020-11-21 LAB — URINE CULTURE: Culture: NO GROWTH

## 2020-11-21 MED ORDER — IRBESARTAN 300 MG PO TABS
150.0000 mg | ORAL_TABLET | Freq: Every day | ORAL | Status: DC
  Administered 2020-11-21 – 2020-11-22 (×2): 150 mg via ORAL
  Filled 2020-11-21 (×2): qty 1

## 2020-11-21 MED ORDER — FUROSEMIDE 40 MG PO TABS
40.0000 mg | ORAL_TABLET | Freq: Every day | ORAL | Status: DC
  Administered 2020-11-21 – 2020-11-22 (×2): 40 mg via ORAL
  Filled 2020-11-21 (×2): qty 1

## 2020-11-21 MED ORDER — HYDRALAZINE HCL 50 MG PO TABS
100.0000 mg | ORAL_TABLET | Freq: Three times a day (TID) | ORAL | Status: DC | PRN
  Administered 2020-11-21 – 2020-11-23 (×2): 100 mg via ORAL
  Filled 2020-11-21 (×2): qty 2

## 2020-11-21 MED ORDER — AMLODIPINE BESYLATE 5 MG PO TABS
5.0000 mg | ORAL_TABLET | Freq: Every day | ORAL | Status: DC
  Administered 2020-11-21 – 2020-11-22 (×2): 5 mg via ORAL
  Filled 2020-11-21 (×2): qty 1

## 2020-11-21 MED ORDER — FUROSEMIDE 10 MG/ML IJ SOLN: 40.0000 mg | Freq: Two times a day (BID) | INTRAMUSCULAR | Status: DC

## 2020-11-21 MED ORDER — CARVEDILOL 3.125 MG PO TABS
3.1250 mg | ORAL_TABLET | Freq: Two times a day (BID) | ORAL | Status: DC
  Administered 2020-11-21 – 2020-11-29 (×17): 3.125 mg via ORAL
  Filled 2020-11-21 (×17): qty 1

## 2020-11-21 MED ORDER — FUROSEMIDE 10 MG/ML IJ SOLN
40.0000 mg | Freq: Once | INTRAMUSCULAR | Status: AC
  Administered 2020-11-21: 40 mg via INTRAVENOUS
  Filled 2020-11-21: qty 4

## 2020-11-21 MED ORDER — HYDRALAZINE HCL 20 MG/ML IJ SOLN
5.0000 mg | INTRAMUSCULAR | Status: DC | PRN
  Administered 2020-11-22 – 2020-11-27 (×4): 5 mg via INTRAVENOUS
  Filled 2020-11-21 (×6): qty 1

## 2020-11-21 NOTE — Evaluation (Signed)
Physical Therapy Evaluation Patient Details Name: Chelsea Moss MRN: [id number] DOB: August 01, 1926 Today's Date: 11/21/2020   History of Present Illness  87 y.o. female presents to [facility] ED on 11/19/2020 with confusion, hypertension, and edema. Pt admitted for hypertensive emergency and acute metabolic encephalopathy. PMH includes hypothyroidism, essential hypertension, chronic kidney disease stage IIIb carotid artery disease status post endarterectomy chronic diastolic heart failure.  Clinical Impression  Pt presents to PT with deficits in functional mobility, power, gait, balance, endurance, and cognition. Pt demonstrates generalized weakness, requiring assistance for bed mobility and transfers at this time. Pt is steady with use of RW but benefits from close supervision for safety at this time. Pt will benefit from aggressive mobilization and acute PT services to improve balance and reduce falls risk.    Follow Up Recommendations Home health PT;Supervision/Assistance - 24 hour    Equipment Recommendations  None recommended by PT    Recommendations for Other Services       Precautions / Restrictions Precautions Precautions: Fall Restrictions Weight Bearing Restrictions: No      Mobility  Bed Mobility Overal bed mobility: Needs Assistance Bed Mobility: Supine to Sit     Supine to sit: Min assist;HOB elevated     General bed mobility comments: pt reports owning an adjustable bed    Transfers Overall transfer level: Needs assistance Equipment used: Rolling walker (2 wheeled) Transfers: Sit to/from Stand Sit to Stand: Min assist;Min guard         General transfer comment: minA from bed, cues for hand placement  Ambulation/Gait Ambulation/Gait assistance: Min guard Gait Distance (Feet): 80 Feet Assistive device: Rolling walker (2 wheeled) Gait Pattern/deviations: Step-through pattern Gait velocity: reduced Gait velocity interpretation: <1.8 ft/sec, indicate of risk for  recurrent falls General Gait Details: pt with slowed step-through gait  Stairs            Wheelchair Mobility    Modified Rankin (Stroke Patients Only)       Balance Overall balance assessment: Needs assistance Sitting-balance support: No upper extremity supported;Feet supported Sitting balance-Leahy Scale: Good     Standing balance support: Single extremity supported;Bilateral upper extremity supported Standing balance-Leahy Scale: Poor Standing balance comment: reliant on UE support of RW                             Pertinent Vitals/Pain Pain Assessment: No/denies pain    Home Living Family/patient expects to be discharged to:: Private residence Living Arrangements: Alone Available Help at Discharge: Family;Available 24 hours/day (PCA 3 days/3 hrs/wk) Type of Home: Apartment Home Access: Level entry;Elevator     Home Layout: One level Home Equipment: Walker - 4 wheels;Cane - single point;Bedside commode;Shower seat      Prior Function Level of Independence: Needs assistance   Gait / Transfers Assistance Needed: pt reports assistance for community ambulation, utilizing a cane and assist from family. Pt mobilizes at a modI level with rollator in the home. Family and aide assist with IADLs           Hand Dominance        Extremity/Trunk Assessment   Upper Extremity Assessment Upper Extremity Assessment: Overall WFL for tasks assessed    Lower Extremity Assessment Lower Extremity Assessment: Generalized weakness    Cervical / Trunk Assessment Cervical / Trunk Assessment: Kyphotic  Communication   Communication: No difficulties  Cognition Arousal/Alertness: Awake/alert Behavior During Therapy: WFL for tasks assessed/performed Overall Cognitive Status: Impaired/Different from baseline Area of  Impairment: Awareness                           Awareness: Emergent          General Comments General comments (skin  integrity, edema, etc.): VSS on RA, pt with some urine leakage with standing on first 2 attempts    Exercises     Assessment/Plan    PT Assessment Patient needs continued PT services  PT Problem List Decreased strength;Decreased activity tolerance;Decreased balance;Decreased mobility;Decreased knowledge of use of DME;Decreased safety awareness       PT Treatment Interventions DME instruction;Gait training;Functional mobility training;Therapeutic activities;Therapeutic exercise;Balance training;Patient/family education    PT Goals (Current goals can be found in the Care Plan section)  Acute Rehab PT Goals Patient Stated Goal: to improve mobility and go home PT Goal Formulation: With patient/family Time For Goal Achievement: 12/05/20 Potential to Achieve Goals: Good    Frequency Min 3X/week   Barriers to discharge        Co-evaluation               AM-PAC PT "6 Clicks" Mobility  Outcome Measure Help needed turning from your back to your side while in a flat bed without using bedrails?: None Help needed moving from lying on your back to sitting on the side of a flat bed without using bedrails?: A Little Help needed moving to and from a bed to a chair (including a wheelchair)?: A Little Help needed standing up from a chair using your arms (e.g., wheelchair or bedside chair)?: A Little Help needed to walk in hospital room?: A Little Help needed climbing 3-5 steps with a railing? : A Little 6 Click Score: 19    End of Session   Activity Tolerance: Patient tolerated treatment well Patient left: in chair;with call bell/phone within reach;with chair alarm set;with family/visitor present Nurse Communication: Mobility status PT Visit Diagnosis: Unsteadiness on feet (R26.81);Other abnormalities of gait and mobility (R26.89);Muscle weakness (generalized) (M62.81)    Time: 1030-1106 PT Time Calculation (min) (ACUTE ONLY): 36 min   Charges:   PT Evaluation $PT Eval Low  Complexity: 1 Low PT Treatments $Therapeutic Activity: 8-22 mins        Arlyss Gandy, PT, DPT Acute Rehabilitation Pager: [phone]   Arlyss Gandy 11/21/2020, 11:34 AM

## 2020-11-21 NOTE — Progress Notes (Signed)
Pt's BP increasing. PRN IV hydralazine given, MD notified.

## 2020-11-21 NOTE — Plan of Care (Signed)

## 2020-11-21 NOTE — Progress Notes (Signed)
[facility] PROGRESS NOTE    Progress Note  Chelsea Moss  ZOX:[id number] DOB: [birth year] DOA: 11/19/2020 PCP: Rolm Gala, NP     Brief Narrative:   Chelsea Moss is an 87 y.o. female past medical history significant for hypothyroidism, essential hypertension, chronic kidney disease stage IIIb carotid artery disease status post endarterectomy chronic diastolic heart failure who presents to the ED with confusion elevated blood pressure and edema in the ED she was found to have a blood pressure systolic of 190 sodium 131, creatinine 1.3, mild leukocytosis with left shift lactic acid 1.1   Assessment/Plan:   Hypertensive emergency: Question likely due to noncompliance. 2D echo was done that showed a preserved EF grade 1 diastolic heart failure no wall motion abnormalities. Blood pressure continues to be high continue current dose of Coreg, IV lasix and hydralazine increase ARB Continue to monitor blood pressure.  Acute metabolic encephalopathy: Has had recurrent hypothermia we will do a Lawyer.  Leukocytosis: Recheck a CBC with differential.  Iron deficiency anemia: Hemoglobin dropped 11.  To 8.9, no signs of overt bleeding recheck a CBC today.  Hypothyroidism: Continue Synthroid.  Chronic kidney disease stage IIIb: Going back through his record his creatinine appears to be at baseline which is around 1.3.  History of CVA: Continue statin and Plavix.    DVT prophylaxis: lovenoxn Family Communication:none Status is: Observation  The patient will require care spanning > 2 midnights and should be moved to inpatient because: Hemodynamically unstable  Dispo: The patient is from: Home              Anticipated d/c is to: Home              Patient currently is not medically stable to d/c.   Difficult to place patient No        Code Status:     Code Status Orders  (From admission, onward)           Start     Ordered   11/19/20 2357  Do not  attempt resuscitation (DNR)  Continuous       Question Answer Comment  In the event of cardiac or respiratory ARREST Do not call a "code blue"   In the event of cardiac or respiratory ARREST Do not perform Intubation, CPR, defibrillation or ACLS   In the event of cardiac or respiratory ARREST Use medication by any route, position, wound care, and other measures to relive pain and suffering. May use oxygen, suction and manual treatment of airway obstruction as needed for comfort.      11/19/20 2357           Code Status History     Date Active Date Inactive Code Status Order ID Comments User Context   11/19/2020 2308 11/19/2020 2357 Full Code [id number]  Synetta Fail, MD ED   11/19/2020 2202 11/19/2020 2308 Full Code [id number]  Colvin Caroli, MD ED         IV Access:   Peripheral IV   Procedures and diagnostic studies:   DG Chest 2 View  Result Date: 11/19/2020 CLINICAL DATA:  Altered mental status EXAM: CHEST - 2 VIEW COMPARISON:  None. FINDINGS: The cardiomediastinal silhouette is unremarkable. There is no evidence of focal airspace disease, pulmonary edema, suspicious pulmonary nodule/mass, pleural effusion, or pneumothorax. No acute bony abnormalities are identified. Lumbar spine surgical hardware noted. IMPRESSION: No active cardiopulmonary disease. Electronically Signed   By: Harmon Pier M.D.   On: 11/19/2020 15:05  CT Head Wo Contrast  Result Date: 11/19/2020 CLINICAL DATA:  87 year old female with altered mental status. EXAM: CT HEAD WITHOUT CONTRAST TECHNIQUE: Contiguous axial images were obtained from the base of the skull through the vertex without intravenous contrast. COMPARISON:  None. FINDINGS: Brain: No evidence of acute infarction, hemorrhage, hydrocephalus, extra-axial collection or mass lesion/mass effect. Atrophy and chronic small-vessel white ischemic changes are noted. Vascular: Carotid atherosclerotic calcifications are noted. Skull: Normal. Negative  for fracture or focal lesion. Sinuses/Orbits: No acute finding. Other: None. IMPRESSION: 1. No evidence of acute intracranial abnormality. 2. Atrophy and chronic small-vessel white ischemic changes. Electronically Signed   By: Harmon Pier M.D.   On: 11/19/2020 18:17   ECHOCARDIOGRAM COMPLETE  Result Date: 11/20/2020    ECHOCARDIOGRAM REPORT   Patient Name:   Chelsea Moss Date of Exam: 11/20/2020 Medical Rec #:  [id number]       Height:       59.0 in Accession #:    [id number]      Weight:       157.0 lb Date of Birth:  09-Sep-1926       BSA:          1.664 m Patient Age:    89 years        BP:           144/48 mmHg Patient Gender: F               HR:           63 bpm. Exam Location:  Inpatient Procedure: 2D Echo Indications:     congestive heart failure  History:         Patient has no prior history of Echocardiogram examinations.                  Chronic kidney disease; Risk Factors:Hypertension and                  Dyslipidemia.  Sonographer:     Delcie Roch Referring Phys:  [id number] Cecille Po MELVIN Diagnosing Phys: Laurance Flatten MD IMPRESSIONS  1. Left ventricular ejection fraction, by estimation, is 65 to 70%. The left ventricle has normal function. The left ventricle has no regional wall motion abnormalities. Left ventricular diastolic parameters are consistent with Grade I diastolic dysfunction (impaired relaxation).  2. Right ventricular systolic function is normal. The right ventricular size is normal.  3. The mitral valve is normal in structure. Trivial mitral valve regurgitation. No evidence of mitral stenosis.  4. The aortic valve is tricuspid. There is mild calcification of the aortic valve. There is moderate thickening of the aortic valve. Aortic valve regurgitation is trivial. Mild to moderate aortic valve sclerosis/calcification is present, without any evidence of aortic stenosis. Comparison(s): Compared to prior echo in 12/2019, the visualization on current study is poor. Overall, no  significant change. FINDINGS  Left Ventricle: Left ventricular ejection fraction, by estimation, is 65 to 70%. The left ventricle has normal function. The left ventricle has no regional wall motion abnormalities. The left ventricular internal cavity size was normal in size. There is  no left ventricular hypertrophy. Left ventricular diastolic parameters are consistent with Grade I diastolic dysfunction (impaired relaxation). Right Ventricle: The right ventricular size is normal. No increase in right ventricular wall thickness. Right ventricular systolic function is normal. Left Atrium: Left atrial size was normal in size. Right Atrium: Right atrial size was normal in size. Pericardium: There is no evidence of pericardial effusion. Mitral Valve: The  mitral valve is normal in structure. There is mild thickening of the mitral valve leaflet(s). There is mild calcification of the mitral valve leaflet(s). Mild to moderate mitral annular calcification. Trivial mitral valve regurgitation.  No evidence of mitral valve stenosis. Tricuspid Valve: The tricuspid valve is normal in structure. Tricuspid valve regurgitation is trivial. Aortic Valve: The aortic valve is tricuspid. There is mild calcification of the aortic valve. There is moderate thickening of the aortic valve. Aortic valve regurgitation is trivial. Mild to moderate aortic valve sclerosis/calcification is present, without any evidence of aortic stenosis. Pulmonic Valve: The pulmonic valve was normal in structure. Pulmonic valve regurgitation is trivial. Aorta: The aortic root and ascending aorta are structurally normal, with no evidence of dilitation. Venous: The inferior vena cava was not well visualized. IAS/Shunts: No atrial level shunt detected by color flow Doppler.  LEFT VENTRICLE PLAX 2D LVIDd:         4.50 cm  Diastology LVIDs:         2.70 cm  LV e' medial:    6.53 cm/s LV PW:         0.90 cm  LV E/e' medial:  16.7 LV IVS:        0.80 cm  LV e' lateral:    7.40 cm/s LVOT diam:     1.50 cm  LV E/e' lateral: 14.7 LV SV:         55 LV SV Index:   33 LVOT Area:     1.77 cm  RIGHT VENTRICLE RV S prime:     11.40 cm/s TAPSE (M-mode): 2.1 cm LEFT ATRIUM             Index       RIGHT ATRIUM           Index LA diam:        3.60 cm 2.16 cm/m  RA Area:     12.10 cm LA Vol (A2C):   63.6 ml 38.22 ml/m RA Volume:   27.30 ml  16.41 ml/m LA Vol (A4C):   48.5 ml 29.15 ml/m LA Biplane Vol: 55.7 ml 33.47 ml/m  AORTIC VALVE LVOT Vmax:   143.00 cm/s LVOT Vmean:  92.400 cm/s LVOT VTI:    0.309 m  AORTA Ao Root diam: 2.50 cm Ao Asc diam:  2.80 cm MITRAL VALVE MV Area (PHT): 3.60 cm     SHUNTS MV Decel Time: 211 msec     Systemic VTI:  0.31 m MV E velocity: 109.00 cm/s  Systemic Diam: 1.50 cm MV A velocity: 105.00 cm/s MV E/A ratio:  1.04 Laurance Flatten MD Electronically signed by Laurance Flatten MD Signature Date/Time: 11/20/2020/11:57:17 AM    Final (Updated)      Medical Consultants:   None.   Subjective:    Chelsea Moss relates she is feeling good.  Objective:    Vitals:   11/21/20 0359 11/21/20 0506 11/21/20 0600 11/21/20 0741  BP: (!) 186/60 (!) 186/59 (!) 151/39 (!) 180/48  Pulse: 66 73  63  Resp: 20   14  Temp: 98 F (36.7 C)   98.4 F (36.9 C)  TempSrc: Oral   Oral  SpO2: 96%   96%  Weight: 71.3 kg     Height:       SpO2: 96 %   Intake/Output Summary (Last 24 hours) at 11/21/2020 0847 Last data filed at 11/21/2020 0406 Gross per 24 hour  Intake 603 ml  Output 1125 ml  Net -522 ml  Filed Weights   11/20/20 0900 11/21/20 0359  Weight: 70.6 kg 71.3 kg    Exam: General exam: In no acute distress. Respiratory system: Good air movement and clear to auscultation. Cardiovascular system: S1 & S2 heard, RRR. No JVD. Gastrointestinal system: Abdomen is nondistended, soft and nontender.  Extremities: No pedal edema. Skin: No rashes, lesions or ulcers Psychiatry: Judgement and insight appear normal. Mood & affect appropriate. Data  Reviewed:    Labs: Basic Metabolic Panel: Recent Labs  Lab 11/19/20 1442 11/20/20 0340  NA 131* 132*  K 4.2 4.3  CL 98 97*  CO2 22 26  GLUCOSE 181* 103*  BUN 25* 22  CREATININE 1.23* 1.32*  CALCIUM 9.8 9.1    GFR Estimated Creatinine Clearance: 23.4 mL/min (A) (by C-G formula based on SCr of 1.32 mg/dL (H)). Liver Function Tests: Recent Labs  Lab 11/19/20 1442 11/20/20 0340  AST 27 25  ALT 18 15  ALKPHOS 55 38  BILITOT 0.5 0.8  PROT 6.7 5.2*  ALBUMIN 3.7 2.9*    No results for input(s): LIPASE, AMYLASE in the last 168 hours. No results for input(s): AMMONIA in the last 168 hours. Coagulation profile No results for input(s): INR, PROTIME in the last 168 hours. COVID-19 Labs  No results for input(s): DDIMER, FERRITIN, LDH, CRP in the last 72 hours.  Lab Results  Component Value Date   SARSCOV2NAA NEGATIVE 11/19/2020    CBC: Recent Labs  Lab 11/19/20 1442 11/20/20 0340  WBC 11.4* 11.5*  NEUTROABS 10.0*  --   HGB 11.3* 8.9*  HCT 33.7* 25.5*  MCV 97.7 97.3  PLT 369 269    Cardiac Enzymes: No results for input(s): CKTOTAL, CKMB, CKMBINDEX, TROPONINI in the last 168 hours. BNP (last 3 results) No results for input(s): PROBNP in the last 8760 hours. CBG: Recent Labs  Lab 11/19/20 1439  GLUCAP 182*    D-Dimer: No results for input(s): DDIMER in the last 72 hours. Hgb A1c: No results for input(s): HGBA1C in the last 72 hours. Lipid Profile: No results for input(s): CHOL, HDL, LDLCALC, TRIG, CHOLHDL, LDLDIRECT in the last 72 hours. Thyroid function studies: Recent Labs    11/19/20 1729  TSH 3.590    Anemia work up: No results for input(s): VITAMINB12, FOLATE, FERRITIN, TIBC, IRON, RETICCTPCT in the last 72 hours. Sepsis Labs: Recent Labs  Lab 11/19/20 1442 11/19/20 1729 11/19/20 1929 11/20/20 0340  WBC 11.4*  --   --  11.5*  LATICACIDVEN  --  1.4 1.1  --     Microbiology Recent Results (from the past 240 hour(s))  Urine culture      Status: None   Collection Time: 11/19/20  2:42 PM   Specimen: Urine, Random  Result Value Ref Range Status   Specimen Description URINE, RANDOM  Final   Special Requests NONE  Final   Culture   Final    NO GROWTH Performed at [facility], [address] [place], [place] [address]    Report Status 11/21/2020 FINAL  Final  Blood culture (routine x 2)     Status: None (Preliminary result)   Collection Time: 11/19/20  6:25 PM   Specimen: BLOOD  Result Value Ref Range Status   Specimen Description BLOOD SITE NOT SPECIFIED  Final   Special Requests   Final    BOTTLES DRAWN AEROBIC AND ANAEROBIC Blood Culture adequate volume   Culture   Final    NO GROWTH 2 DAYS Performed at [facility],  [address] [place], [place] [address]    Report Status PENDING  Incomplete  Blood culture (routine x 2)     Status: None (Preliminary result)   Collection Time: 11/19/20  6:26 PM   Specimen: BLOOD  Result Value Ref Range Status   Specimen Description BLOOD SITE NOT SPECIFIED  Final   Special Requests   Final    BOTTLES DRAWN AEROBIC AND ANAEROBIC Blood Culture adequate volume   Culture   Final    NO GROWTH 2 DAYS Performed at [facility], [address] [place], [place] [address]    Report Status PENDING  Incomplete  Resp Panel by RT-PCR (Flu A&B, Covid) Nasopharyngeal Swab     Status: None   Collection Time: 11/19/20  6:26 PM   Specimen: Nasopharyngeal Swab; Nasopharyngeal(NP) swabs in vial transport medium  Result Value Ref Range Status   SARS Coronavirus 2 by RT PCR NEGATIVE NEGATIVE Final    Comment: (NOTE) SARS-CoV-2 target nucleic acids are NOT DETECTED.  The SARS-CoV-2 RNA is generally detectable in upper respiratory specimens during the acute phase of infection. The lowest concentration of SARS-CoV-2 viral copies this assay can detect is 138 copies/mL. A negative result does not preclude SARS-Cov-2 infection and should not be used as the sole basis for  treatment or other patient management decisions. A negative result may occur with  improper specimen collection/handling, submission of specimen other than nasopharyngeal swab, presence of viral mutation(s) within the areas targeted by this assay, and inadequate number of viral copies(<138 copies/mL). A negative result must be combined with clinical observations, patient history, and epidemiological information. The expected result is Negative.  Fact Sheet for Patients:  BloggerCourse.comhttps://www.fda.gov/media/152166/download  Fact Sheet for Healthcare Providers:  SeriousBroker.ithttps://www.fda.gov/media/152162/download  This test is no t yet approved or cleared by the [place] FDA and  has been authorized for detection and/or diagnosis of SARS-CoV-2 by FDA under an Emergency Use Authorization (EUA). This EUA will remain  in effect (meaning this test can be used) for the duration of the COVID-19 declaration under Section 564(b)(1) of the Act, 21 U.S.C.section 360bbb-3(b)(1), unless the authorization is terminated  or revoked sooner.       Influenza A by PCR NEGATIVE NEGATIVE Final   Influenza B by PCR NEGATIVE NEGATIVE Final    Comment: (NOTE) The Xpert Xpress SARS-CoV-2/FLU/RSV plus assay is intended as an aid in the diagnosis of influenza from Nasopharyngeal swab specimens and should not be used as a sole basis for treatment. Nasal washings and aspirates are unacceptable for Xpert Xpress SARS-CoV-2/FLU/RSV testing.  Fact Sheet for Patients: BloggerCourse.comhttps://www.fda.gov/media/152166/download  Fact Sheet for Healthcare Providers: SeriousBroker.ithttps://www.fda.gov/media/152162/download  This test is not yet approved or cleared by the [place] FDA and has been authorized for detection and/or diagnosis of SARS-CoV-2 by FDA under an Emergency Use Authorization (EUA). This EUA will remain in effect (meaning this test can be used) for the duration of the COVID-19 declaration under Section 564(b)(1) of the Act, 21  U.S.C. section 360bbb-3(b)(1), unless the authorization is terminated or revoked.  Performed at [facility], [address] [place], [place] [address]      Medications:    carvedilol  3.125 mg Oral BID WC   cephALEXin  500 mg Oral BID   clopidogrel  75 mg Oral QHS   enoxaparin (LOVENOX) injection  40 mg Subcutaneous Daily   ferrous sulfate  325 mg Oral Q lunch   furosemide  20 mg Intravenous Q12H   gabapentin  300 mg Oral QHS   irbesartan  37.5 mg Oral Daily   levothyroxine  25 mcg Oral QAC breakfast   pantoprazole  40 mg Oral q morning   polyethylene glycol  17 g Oral Daily   silver sulfADIAZINE  1 application Topical Daily   sodium chloride flush  3 mL Intravenous Q12H   Continuous Infusions:    LOS: 1 day   Marinda Elk  [facility]  11/21/2020, 8:47 AM

## 2020-11-22 DIAGNOSIS — I160 Hypertensive urgency: Secondary | ICD-10-CM

## 2020-11-22 LAB — BASIC METABOLIC PANEL
Anion gap: 10 (ref 5–15)
BUN: 24 mg/dL — ABNORMAL HIGH (ref 8–23)
CO2: 29 mmol/L (ref 22–32)
Calcium: 8.9 mg/dL (ref 8.9–10.3)
Chloride: 95 mmol/L — ABNORMAL LOW (ref 98–111)
Creatinine, Ser: 1.51 mg/dL — ABNORMAL HIGH (ref 0.44–1.00)
GFR, Estimated: 32 mL/min — ABNORMAL LOW (ref >60)
Glucose, Bld: 106 mg/dL — ABNORMAL HIGH (ref 70–99)
Potassium: 4.2 mmol/L (ref 3.5–5.1)
Sodium: 134 mmol/L — ABNORMAL LOW (ref 135–145)

## 2020-11-22 MED ORDER — ENOXAPARIN SODIUM 30 MG/0.3ML IJ SOSY
30.0000 mg | PREFILLED_SYRINGE | Freq: Every day | INTRAMUSCULAR | Status: DC
  Administered 2020-11-23 – 2020-11-29 (×7): 30 mg via SUBCUTANEOUS
  Filled 2020-11-22 (×7): qty 0.3

## 2020-11-22 MED ORDER — ISOSORBIDE MONONITRATE ER 30 MG PO TB24
15.0000 mg | ORAL_TABLET | Freq: Every day | ORAL | Status: DC
  Administered 2020-11-22 – 2020-11-23 (×2): 15 mg via ORAL
  Filled 2020-11-22 (×2): qty 1

## 2020-11-22 MED ORDER — BUSPIRONE HCL 5 MG PO TABS
5.0000 mg | ORAL_TABLET | Freq: Two times a day (BID) | ORAL | Status: DC
  Administered 2020-11-22 – 2020-11-29 (×15): 5 mg via ORAL
  Filled 2020-11-22 (×15): qty 1

## 2020-11-22 MED ORDER — IRBESARTAN 300 MG PO TABS
300.0000 mg | ORAL_TABLET | Freq: Every day | ORAL | Status: DC
  Administered 2020-11-23 – 2020-11-29 (×7): 300 mg via ORAL
  Filled 2020-11-22 (×7): qty 1

## 2020-11-22 MED ORDER — AMLODIPINE BESYLATE 10 MG PO TABS
10.0000 mg | ORAL_TABLET | Freq: Every day | ORAL | Status: DC
  Administered 2020-11-23 – 2020-11-29 (×7): 10 mg via ORAL
  Filled 2020-11-22 (×7): qty 1

## 2020-11-22 NOTE — Plan of Care (Signed)

## 2020-11-22 NOTE — Progress Notes (Signed)
[facility] PROGRESS NOTE    Progress Note  Chelsea PortelaRosemarie Moss  ZOX:[id number] DOB: 02-Dec-1926 DOA: 11/19/2020 PCP: Rolm GalaHeyn, Sharon, NP     Brief Narrative:   Chelsea Moss is an 87 y.o. female past medical history significant for hypothyroidism, essential hypertension, chronic kidney disease stage IIIb carotid artery disease status post endarterectomy chronic diastolic heart failure who presents to the ED with confusion elevated blood pressure and edema in the ED she was found to have a blood pressure systolic of 190 sodium 131, creatinine 1.3, mild leukocytosis with left shift lactic acid 1.1   Assessment/Plan:   Hypertensive emergency: Question likely due to noncompliance. 2D echo was done that showed a preserved EF grade 1 diastolic heart failure no wall motion abnormalities. Blood pressure continues to be elevated she is requiring frequent hydralazine doses as needed. Continue Coreg Avapro and hydralazine.  Add low-dose Imdur. Will need to hold Lasix due to mild rise in his creatinine. Physical therapy evaluated the patient recommended home health PT.  Mild acute kidney injury on chronic kidney disease stage III yea:  Likely due to overdiuresis versus correction of her blood pressure.  Hold diuretic therapy. Recheck a basic metabolic panel in the morning.  Acute metabolic encephalopathy: Has had recurrent hypothermia we will do a LawyerBair hugger.  Leukocytosis: Resolved likely stress margination.  Iron deficiency anemia: Hemoglobin dropped 11.  To 8.9, no signs of overt bleeding recheck a CBC today.  Hypothyroidism: Continue Synthroid.  Chronic kidney disease stage IIIb: Going back through his record his creatinine appears to be at baseline which is around 1.3.  History of CVA: Continue statin and Plavix.    DVT prophylaxis: lovenoxn Family Communication:none Status is: Observation  The patient will require care spanning > 2 midnights and should be moved to  inpatient because: Hemodynamically unstable  Dispo: The patient is from: Home              Anticipated d/c is to: Home              Patient currently is not medically stable to d/c.   Difficult to place patient No   Code Status:     Code Status Orders  (From admission, onward)           Start     Ordered   11/19/20 2357  Do not attempt resuscitation (DNR)  Continuous       Question Answer Comment  In the event of cardiac or respiratory ARREST Do not call a "code blue"   In the event of cardiac or respiratory ARREST Do not perform Intubation, CPR, defibrillation or ACLS   In the event of cardiac or respiratory ARREST Use medication by any route, position, wound care, and other measures to relive pain and suffering. May use oxygen, suction and manual treatment of airway obstruction as needed for comfort.      11/19/20 2357           Code Status History     Date Active Date Inactive Code Status Order ID Comments User Context   11/19/2020 2308 11/19/2020 2357 Full Code [id number]  Synetta FailMelvin, Alexander B, MD ED   11/19/2020 2202 11/19/2020 2308 Full Code [id number]  Colvin Carolihandrasekar, Additya, MD ED         IV Access:   Peripheral IV   Procedures and diagnostic studies:   ECHOCARDIOGRAM COMPLETE  Result Date: 11/20/2020    ECHOCARDIOGRAM REPORT   Patient Name:   Chelsea PortelaROSEMARIE Moss Date of Exam: 11/20/2020 Medical Rec #:  [id number]       Height:       59.0 in Accession #:    [id number]      Weight:       157.0 lb Date of Birth:  Jul 21, 1926       BSA:          1.664 m Patient Age:    87 years        BP:           144/48 mmHg Patient Gender: F               HR:           63 bpm. Exam Location:  Inpatient Procedure: 2D Echo Indications:     congestive heart failure  History:         Patient has no prior history of Echocardiogram examinations.                  Chronic kidney disease; Risk Factors:Hypertension and                  Dyslipidemia.  Sonographer:     Delcie Roch Referring Phys:   [id number] Cecille Po Chelsea Moss Diagnosing Phys: Laurance Flatten MD IMPRESSIONS  1. Left ventricular ejection fraction, by estimation, is 65 to 70%. The left ventricle has normal function. The left ventricle has no regional wall motion abnormalities. Left ventricular diastolic parameters are consistent with Grade I diastolic dysfunction (impaired relaxation).  2. Right ventricular systolic function is normal. The right ventricular size is normal.  3. The mitral valve is normal in structure. Trivial mitral valve regurgitation. No evidence of mitral stenosis.  4. The aortic valve is tricuspid. There is mild calcification of the aortic valve. There is moderate thickening of the aortic valve. Aortic valve regurgitation is trivial. Mild to moderate aortic valve sclerosis/calcification is present, without any evidence of aortic stenosis. Comparison(s): Compared to prior echo in 12/2019, the visualization on current study is poor. Overall, no significant change. FINDINGS  Left Ventricle: Left ventricular ejection fraction, by estimation, is 65 to 70%. The left ventricle has normal function. The left ventricle has no regional wall motion abnormalities. The left ventricular internal cavity size was normal in size. There is  no left ventricular hypertrophy. Left ventricular diastolic parameters are consistent with Grade I diastolic dysfunction (impaired relaxation). Right Ventricle: The right ventricular size is normal. No increase in right ventricular wall thickness. Right ventricular systolic function is normal. Left Atrium: Left atrial size was normal in size. Right Atrium: Right atrial size was normal in size. Pericardium: There is no evidence of pericardial effusion. Mitral Valve: The mitral valve is normal in structure. There is mild thickening of the mitral valve leaflet(s). There is mild calcification of the mitral valve leaflet(s). Mild to moderate mitral annular calcification. Trivial mitral valve regurgitation.  No  evidence of mitral valve stenosis. Tricuspid Valve: The tricuspid valve is normal in structure. Tricuspid valve regurgitation is trivial. Aortic Valve: The aortic valve is tricuspid. There is mild calcification of the aortic valve. There is moderate thickening of the aortic valve. Aortic valve regurgitation is trivial. Mild to moderate aortic valve sclerosis/calcification is present, without any evidence of aortic stenosis. Pulmonic Valve: The pulmonic valve was normal in structure. Pulmonic valve regurgitation is trivial. Aorta: The aortic root and ascending aorta are structurally normal, with no evidence of dilitation. Venous: The inferior vena cava was not well visualized. IAS/Shunts: No atrial level shunt detected by color flow Doppler.  LEFT VENTRICLE PLAX 2D LVIDd:         4.50 cm  Diastology LVIDs:         2.70 cm  LV e' medial:    6.53 cm/s LV PW:         0.90 cm  LV E/e' medial:  16.7 LV IVS:        0.80 cm  LV e' lateral:   7.40 cm/s LVOT diam:     1.50 cm  LV E/e' lateral: 14.7 LV SV:         55 LV SV Index:   33 LVOT Area:     1.77 cm  RIGHT VENTRICLE RV S prime:     11.40 cm/s TAPSE (M-mode): 2.1 cm LEFT ATRIUM             Index       RIGHT ATRIUM           Index LA diam:        3.60 cm 2.16 cm/m  RA Area:     12.10 cm LA Vol (A2C):   63.6 ml 38.22 ml/m RA Volume:   27.30 ml  16.41 ml/m LA Vol (A4C):   48.5 ml 29.15 ml/m LA Biplane Vol: 55.7 ml 33.47 ml/m  AORTIC VALVE LVOT Vmax:   143.00 cm/s LVOT Vmean:  92.400 cm/s LVOT VTI:    0.309 m  AORTA Ao Root diam: 2.50 cm Ao Asc diam:  2.80 cm MITRAL VALVE MV Area (PHT): 3.60 cm     SHUNTS MV Decel Time: 211 msec     Systemic VTI:  0.31 m MV E velocity: 109.00 cm/s  Systemic Diam: 1.50 cm MV A velocity: 105.00 cm/s MV E/A ratio:  1.04 Laurance Flatten MD Electronically signed by Laurance Flatten MD Signature Date/Time: 11/20/2020/11:57:17 AM    Final (Updated)      Medical Consultants:   None.   Subjective:    Janiya Hamid little recall  no complaints.  Objective:    Vitals:   11/22/20 0100 11/22/20 0515 11/22/20 0651 11/22/20 0751  BP: (!) 135/45 (!) 175/58 (!) 187/58 (!) 139/30  Pulse: 61 81  61  Resp: Temp: 98 F (36.7 C) 97.8 F (36.6 C)  98.2 F (36.8 C)  TempSrc:  Oral  Oral  SpO2: 95% 94%  97%  Weight: 69.3 kg     Height:       SpO2: 97 %   Intake/Output Summary (Last 24 hours) at 11/22/2020 0920 Last data filed at 11/22/2020 0520 Gross per 24 hour  Intake 960 ml  Output 2000 ml  Net -1040 ml    Filed Weights   11/20/20 0900 11/21/20 0359 11/22/20 0100  Weight: 70.6 kg 71.3 kg 69.3 kg    Exam: General exam: In no acute distress. Respiratory system: Good air movement and clear to auscultation. Cardiovascular system: S1 & S2 heard, RRR. No JVD. Gastrointestinal system: Abdomen is nondistended, soft and nontender.  Extremities: No pedal edema. Skin: No rashes, lesions or ulcers  Data Reviewed:    Labs: Basic Metabolic Panel: Recent Labs  Lab 11/19/20 1442 11/20/20 0340 11/22/20 0233  NA 131* 132* 134*  K 4.2 4.3 4.2  CL 98 97* 95*  CO2 GLUCOSE 181* 103* 106*  BUN 25* 22 24*  CREATININE 1.23* 1.32* 1.51*  CALCIUM 9.8 9.1 8.9    GFR Estimated Creatinine Clearance: 20.1 mL/min (A) (by C-G formula based on SCr of 1.51  mg/dL (H)). Liver Function Tests: Recent Labs  Lab 11/19/20 1442 11/20/20 0340  AST 27 25  ALT 18 15  ALKPHOS 55 38  BILITOT 0.5 0.8  PROT 6.7 5.2*  ALBUMIN 3.7 2.9*    No results for input(s): LIPASE, AMYLASE in the last 168 hours. No results for input(s): AMMONIA in the last 168 hours. Coagulation profile No results for input(s): INR, PROTIME in the last 168 hours. COVID-19 Labs  No results for input(s): DDIMER, FERRITIN, LDH, CRP in the last 72 hours.  Lab Results  Component Value Date   SARSCOV2NAA NEGATIVE 11/19/2020    CBC: Recent Labs  Lab 11/19/20 1442 11/20/20 0340 11/21/20 0940  WBC 11.4* 11.5* 7.7  NEUTROABS  10.0*  --  5.7  HGB 11.3* 8.9* 9.8*  HCT 33.7* 25.5* 29.0*  MCV 97.7 97.3 98.3  PLT 369 269 285    Cardiac Enzymes: No results for input(s): CKTOTAL, CKMB, CKMBINDEX, TROPONINI in the last 168 hours. BNP (last 3 results) No results for input(s): PROBNP in the last 8760 hours. CBG: Recent Labs  Lab 11/19/20 1439  GLUCAP 182*    D-Dimer: No results for input(s): DDIMER in the last 72 hours. Hgb A1c: No results for input(s): HGBA1C in the last 72 hours. Lipid Profile: No results for input(s): CHOL, HDL, LDLCALC, TRIG, CHOLHDL, LDLDIRECT in the last 72 hours. Thyroid function studies: Recent Labs    11/19/20 1729  TSH 3.590    Anemia work up: No results for input(s): VITAMINB12, FOLATE, FERRITIN, TIBC, IRON, RETICCTPCT in the last 72 hours. Sepsis Labs: Recent Labs  Lab 11/19/20 1442 11/19/20 1729 11/19/20 1929 11/20/20 0340 11/21/20 0940  WBC 11.4*  --   --  11.5* 7.7  LATICACIDVEN  --  1.4 1.1  --   --     Microbiology Recent Results (from the past 240 hour(s))  Urine culture     Status: None   Collection Time: 11/19/20  2:42 PM   Specimen: Urine, Random  Result Value Ref Range Status   Specimen Description URINE, RANDOM  Final   Special Requests NONE  Final   Culture   Final    NO GROWTH Performed at [facility], [address] [place], [place] [address]    Report Status 11/21/2020 FINAL  Final  Blood culture (routine x 2)     Status: None (Preliminary result)   Collection Time: 11/19/20  6:25 PM   Specimen: BLOOD  Result Value Ref Range Status   Specimen Description BLOOD SITE NOT SPECIFIED  Final   Special Requests   Final    BOTTLES DRAWN AEROBIC AND ANAEROBIC Blood Culture adequate volume   Culture   Final    NO GROWTH 3 DAYS Performed at [facility], [address], [place], [place] [address]    Report Status PENDING  Incomplete  Blood culture (routine x 2)     Status: None (Preliminary result)   Collection Time: 11/19/20  6:26  PM   Specimen: BLOOD  Result Value Ref Range Status   Specimen Description BLOOD SITE NOT SPECIFIED  Final   Special Requests   Final    BOTTLES DRAWN AEROBIC AND ANAEROBIC Blood Culture adequate volume   Culture   Final    NO GROWTH 3 DAYS Performed at [facility], [address] [place], [place] [address]    Report Status PENDING  Incomplete  Resp Panel by RT-PCR (Flu A&B, Covid) Nasopharyngeal Swab  Status: None   Collection Time: 11/19/20  6:26 PM   Specimen: Nasopharyngeal Swab; Nasopharyngeal(NP) swabs in vial transport medium  Result Value Ref Range Status   SARS Coronavirus 2 by RT PCR NEGATIVE NEGATIVE Final    Comment: (NOTE) SARS-CoV-2 target nucleic acids are NOT DETECTED.  The SARS-CoV-2 RNA is generally detectable in upper respiratory specimens during the acute phase of infection. The lowest concentration of SARS-CoV-2 viral copies this assay can detect is 138 copies/mL. A negative result does not preclude SARS-Cov-2 infection and should not be used as the sole basis for treatment or other patient management decisions. A negative result may occur with  improper specimen collection/handling, submission of specimen other than nasopharyngeal swab, presence of viral mutation(s) within the areas targeted by this assay, and inadequate number of viral copies(<138 copies/mL). A negative result must be combined with clinical observations, patient history, and epidemiological information. The expected result is Negative.  Fact Sheet for Patients:  BloggerCourse.com  Fact Sheet for Healthcare Providers:  SeriousBroker.it  This test is no t yet approved or cleared by the [place] FDA and  has been authorized for detection and/or diagnosis of SARS-CoV-2 by FDA under an Emergency Use Authorization (EUA). This EUA will remain  in effect (meaning this test can be used) for the duration of the COVID-19 declaration  under Section 564(b)(1) of the Act, 21 U.S.C.section 360bbb-3(b)(1), unless the authorization is terminated  or revoked sooner.       Influenza A by PCR NEGATIVE NEGATIVE Final   Influenza B by PCR NEGATIVE NEGATIVE Final    Comment: (NOTE) The Xpert Xpress SARS-CoV-2/FLU/RSV plus assay is intended as an aid in the diagnosis of influenza from Nasopharyngeal swab specimens and should not be used as a sole basis for treatment. Nasal washings and aspirates are unacceptable for Xpert Xpress SARS-CoV-2/FLU/RSV testing.  Fact Sheet for Patients: BloggerCourse.com  Fact Sheet for Healthcare Providers: SeriousBroker.it  This test is not yet approved or cleared by the [place] FDA and has been authorized for detection and/or diagnosis of SARS-CoV-2 by FDA under an Emergency Use Authorization (EUA). This EUA will remain in effect (meaning this test can be used) for the duration of the COVID-19 declaration under Section 564(b)(1) of the Act, 21 U.S.C. section 360bbb-3(b)(1), unless the authorization is terminated or revoked.  Performed at [facility], [address], [place], [place] [address]      Medications:    amLODipine  5 mg Oral Daily   carvedilol  3.125 mg Oral BID WC   clopidogrel  75 mg Oral QHS   enoxaparin (LOVENOX) injection  40 mg Subcutaneous Daily   ferrous sulfate  325 mg Oral Q lunch   furosemide  40 mg Oral Daily   gabapentin  300 mg Oral QHS   irbesartan  150 mg Oral Daily   levothyroxine  25 mcg Oral QAC breakfast   pantoprazole  40 mg Oral q morning   silver sulfADIAZINE  1 application Topical Daily   sodium chloride flush  3 mL Intravenous Q12H   Continuous Infusions:    LOS: 2 days   Marinda Elk  [facility]  11/22/2020, 9:20 AM

## 2020-11-23 LAB — COMPREHENSIVE METABOLIC PANEL
ALT: 15 U/L (ref 0–44)
AST: 23 U/L (ref 15–41)
Albumin: 3.1 g/dL — ABNORMAL LOW (ref 3.5–5.0)
Alkaline Phosphatase: 46 U/L (ref 38–126)
Anion gap: 10 (ref 5–15)
BUN: 21 mg/dL (ref 8–23)
CO2: 28 mmol/L (ref 22–32)
Calcium: 8.8 mg/dL — ABNORMAL LOW (ref 8.9–10.3)
Chloride: 95 mmol/L — ABNORMAL LOW (ref 98–111)
Creatinine, Ser: 1.38 mg/dL — ABNORMAL HIGH (ref 0.44–1.00)
GFR, Estimated: 36 mL/min — ABNORMAL LOW (ref >60)
Glucose, Bld: 187 mg/dL — ABNORMAL HIGH (ref 70–99)
Potassium: 3.7 mmol/L (ref 3.5–5.1)
Sodium: 133 mmol/L — ABNORMAL LOW (ref 135–145)
Total Bilirubin: 0.7 mg/dL (ref 0.3–1.2)
Total Protein: 6 g/dL — ABNORMAL LOW (ref 6.5–8.1)

## 2020-11-23 LAB — CBC WITH DIFFERENTIAL/PLATELET
Abs Immature Granulocytes: 0.04 10*3/uL (ref 0.00–0.07)
Basophils Absolute: 0 10*3/uL (ref 0.0–0.1)
Basophils Relative: 0 %
Eosinophils Absolute: 0.1 10*3/uL (ref 0.0–0.5)
Eosinophils Relative: 1 %
HCT: 30.8 % — ABNORMAL LOW (ref 36.0–46.0)
Hemoglobin: 10.5 g/dL — ABNORMAL LOW (ref 12.0–15.0)
Immature Granulocytes: 0 %
Lymphocytes Relative: 7 %
Lymphs Abs: 0.7 10*3/uL (ref 0.7–4.0)
MCH: 33.3 pg (ref 26.0–34.0)
MCHC: 34.1 g/dL (ref 30.0–36.0)
MCV: 97.8 fL (ref 80.0–100.0)
Monocytes Absolute: 0.7 10*3/uL (ref 0.1–1.0)
Monocytes Relative: 7 %
Neutro Abs: 8.9 10*3/uL — ABNORMAL HIGH (ref 1.7–7.7)
Neutrophils Relative %: 85 %
Platelets: 354 10*3/uL (ref 150–400)
RBC: 3.15 MIL/uL — ABNORMAL LOW (ref 3.87–5.11)
RDW: 13.2 % (ref 11.5–15.5)
WBC: 10.4 10*3/uL (ref 4.0–10.5)
nRBC: 0 % (ref 0.0–0.2)

## 2020-11-23 LAB — MAGNESIUM: Magnesium: 1.9 mg/dL (ref 1.7–2.4)

## 2020-11-23 LAB — PHOSPHORUS: Phosphorus: 2.8 mg/dL (ref 2.5–4.6)

## 2020-11-23 MED ORDER — ISOSORBIDE MONONITRATE ER 30 MG PO TB24
30.0000 mg | ORAL_TABLET | Freq: Every day | ORAL | Status: DC
  Administered 2020-11-24: 30 mg via ORAL
  Filled 2020-11-23: qty 1

## 2020-11-23 MED ORDER — ISOSORBIDE MONONITRATE ER 30 MG PO TB24
15.0000 mg | ORAL_TABLET | Freq: Once | ORAL | Status: AC
  Administered 2020-11-23: 15 mg via ORAL
  Filled 2020-11-23: qty 1

## 2020-11-23 MED ORDER — TRAMADOL HCL 50 MG PO TABS
50.0000 mg | ORAL_TABLET | Freq: Two times a day (BID) | ORAL | Status: DC | PRN
  Administered 2020-11-23 – 2020-11-25 (×3): 50 mg via ORAL
  Filled 2020-11-23 (×3): qty 1

## 2020-11-23 NOTE — Progress Notes (Signed)
PROGRESS NOTE    Chelsea Moss  QPY:[id number] DOB: Jan 05, 1927 DOA: 11/19/2020 PCP: Rolm Gala, NP     Brief Narrative:  Chelsea Moss is an 87 y.o. WF PMHx Hypothyroidism, essential HTN, CAD, chronic diastolic CHF, CAD s/p  endarterectomy, CKD stage IIIb,   Presents to the ED with confusion elevated blood pressure and edema in the ED she was found to have a blood pressure systolic of 190 sodium 131, creatinine 1.3, mild leukocytosis with left shift lactic acid 1.1   Subjective: A/O x4, patient states has chronic back pain secondary to surgery.  States at home takes diclofenac for back pain.   Assessment & Plan: Covid vaccination; vaccinated 1/3.  States does not want remainder of vaccinations because she is 87 years old.   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 -2D echo was done that showed a preserved EF grade 1 diastolic heart failure no wall motion abnormalities. -Physical therapy evaluated the patient recommended home health PT.   Acute metabolic encephalopathy: Has had recurrent hypothermia Cytogeneticist.PRN   Leukocytosis: Resolved likely stress margination.   Iron deficiency anemia: Hemoglobin dropped 11.  To 8.9, no signs of overt bleeding recheck a CBC today.  Hypothyroidism: Continue Synthroid.  Acute on CKD Stage IIIb: (Baseline Cr 1.3) Lab Results  Component Value Date   CREATININE 1.51 (H) 11/22/2020   CREATININE 1.32 (H) 11/20/2020   CREATININE 1.23 (H) 11/19/2020   -Most likely secondary to overdiuresis  Chronic back pain - Normally on Diclofenac 75 mg BID, not ordered I am assuming secondary to her acute on CKD stage IIIb.  Would restart once patient's renal function returns to normal. - 7/6 tramadol 50 mg PRN   History of CVA: Continue statin and Plavix.        DVT prophylaxis:  Code Status:  Family Communication:  Status is: Inpatient    Dispo: The patient is from:               Anticipated d/c is to:               Anticipated d/c date is:               Patient currently       Consultants:    Procedures/Significant Events:    I have personally reviewed and interpreted all radiology studies and my findings are as above.  VENTILATOR SETTINGS:    Cultures   Antimicrobials:    Devices    LINES / TUBES:      Continuous Infusions:   Objective: Vitals:   11/23/20 0336 11/23/20 0622 11/23/20 0958 11/23/20 1106  BP: (!) 181/42 (!) 157/47 (!) 150/54   Pulse: 73 79    Resp: 15     Temp: 98.3 F (36.8 C)   (!) 97.5 F (36.4 C)  TempSrc: Oral   Oral  SpO2: 94%     Weight:      Height:        Intake/Output Summary (Last 24 hours) at 11/23/2020 1213 Last data filed  at 11/23/2020 1109 Gross per 24 hour  Intake 963 ml  Output 350 ml  Net 613 ml   Filed Weights   11/21/20 0359 11/22/20 0100 11/23/20 0334  Weight: 71.3 kg 69.3 kg 69.3 kg    Examination:  General: A/O x4, No acute respiratory distress Eyes: negative scleral hemorrhage, negative anisocoria, negative icterus ENT: Negative Runny nose, negative gingival bleeding, Neck:  Negative scars, masses, torticollis, lymphadenopathy, JVD Lungs: Clear to auscultation bilaterally without wheezes or crackles Cardiovascular: Regular rate and rhythm without murmur gallop or rub normal S1 and S2 Abdomen: negative abdominal pain, nondistended, positive soft, bowel sounds, no rebound, no ascites, no appreciable mass Extremities: No significant cyanosis,  clubbing, or edema bilateral lower extremities Musculoskeletal; pain to palpation lower back. Skin: Negative rashes, lesions, ulcers Psychiatric:  Negative depression, negative anxiety, negative fatigue, negative mania  Central nervous system:  Cranial nerves II through XII intact, tongue/uvula midline, all extremities muscle strength 5/5, sensation intact throughout, negative dysarthria, negative expressive aphasia, negative receptive aphasia.  .     Data Reviewed: Care during the described time interval was provided by me .  I have reviewed this patient's available data, including medical history, events of note, physical examination, and all test results as part of my evaluation.  CBC: Recent Labs  Lab 11/19/20 1442 11/20/20 0340 11/21/20 0940  WBC 11.4* 11.5* 7.7  NEUTROABS 10.0*  --  5.7  HGB 11.3* 8.9* 9.8*  HCT 33.7* 25.5* 29.0*  MCV 97.7 97.3 98.3  PLT 369 269 285   Basic Metabolic Panel: Recent Labs  Lab 11/19/20 1442 11/20/20 0340 11/22/20 0233  NA 131* 132* 134*  K 4.2 4.3 4.2  CL 98 97* 95*  CO2 22 26 29   GLUCOSE 181* 103* 106*  BUN 25* 22 24*  CREATININE 1.23* 1.32* 1.51*  CALCIUM 9.8 9.1 8.9   GFR: Estimated Creatinine Clearance: 20.1 mL/min (A) (by C-G formula based on SCr of 1.51 mg/dL (H)). Liver Function Tests: Recent Labs  Lab 11/19/20 1442 11/20/20 0340  AST 27 25  ALT 18 15  ALKPHOS 55 38  BILITOT 0.5 0.8  PROT 6.7 5.2*  ALBUMIN 3.7 2.9*   No results for input(s): LIPASE, AMYLASE in the last 168 hours. No results for input(s): AMMONIA in the last 168 hours. Coagulation Profile: No results for input(s): INR, PROTIME in the last 168 hours. Cardiac Enzymes: No results for input(s): CKTOTAL, CKMB, CKMBINDEX, TROPONINI in the last 168 hours. BNP (last 3 results) No results for input(s): PROBNP in the last 8760 hours. HbA1C: No results for input(s): HGBA1C in the last 72 hours. CBG: Recent Labs  Lab 11/19/20 1439  GLUCAP 182*    Lipid Profile: No results for input(s): CHOL, HDL, LDLCALC, TRIG, CHOLHDL, LDLDIRECT in the last 72 hours. Thyroid Function Tests: No results for input(s): TSH, T4TOTAL, FREET4, T3FREE, THYROIDAB in the last 72 hours. Anemia Panel: No results for input(s): VITAMINB12, FOLATE, FERRITIN, TIBC, IRON, RETICCTPCT in the last 72 hours. Sepsis Labs: Recent Labs  Lab 11/19/20 1729 11/19/20 1929  LATICACIDVEN 1.4 1.1    Recent Results (from the past 240 hour(s))  Urine culture     Status: None   Collection Time: 11/19/20  2:42 PM   Specimen: Urine, Random  Result Value Ref Range Status   Specimen Description URINE, RANDOM  Final   Special Requests NONE  Final   Culture   Final    NO GROWTH Performed at [facility], [address], [place],  [place] [address]    Report Status 11/21/2020 FINAL  Final  Blood culture (routine x 2)     Status: None (Preliminary result)   Collection Time: 11/19/20  6:25 PM   Specimen: BLOOD  Result Value Ref Range Status   Specimen Description BLOOD SITE NOT SPECIFIED  Final   Special Requests   Final    BOTTLES DRAWN AEROBIC AND ANAEROBIC Blood Culture adequate volume   Culture   Final    NO GROWTH 4 DAYS Performed at [facility], [address] [place], [place] [address]    Report Status PENDING  Incomplete  Blood culture (routine x 2)     Status: None (Preliminary result)   Collection Time: 11/19/20  6:26 PM   Specimen: BLOOD  Result Value Ref Range Status   Specimen Description BLOOD SITE NOT SPECIFIED  Final   Special Requests   Final    BOTTLES DRAWN AEROBIC AND ANAEROBIC Blood Culture adequate volume   Culture   Final    NO GROWTH 4 DAYS Performed at [facility], [address] [place], [place] [address]    Report Status PENDING  Incomplete  Resp Panel by RT-PCR (Flu A&B, Covid) Nasopharyngeal Swab     Status: None   Collection Time: 11/19/20  6:26 PM   Specimen: Nasopharyngeal Swab; Nasopharyngeal(NP) swabs in  vial transport medium  Result Value Ref Range Status   SARS Coronavirus 2 by RT PCR NEGATIVE NEGATIVE Final    Comment: (NOTE) SARS-CoV-2 target nucleic acids are NOT DETECTED.  The SARS-CoV-2 RNA is generally detectable in upper respiratory specimens during the acute phase of infection. The lowest concentration of SARS-CoV-2 viral copies this assay can detect is 138 copies/mL. A negative result does not preclude SARS-Cov-2 infection and should not be used as the sole basis for treatment or other patient management decisions. A negative result may occur with  improper specimen collection/handling, submission of specimen other than nasopharyngeal swab, presence of viral mutation(s) within the areas targeted by this assay, and inadequate number of viral copies(<138 copies/mL). A negative result must be combined with clinical observations, patient history, and epidemiological information. The expected result is Negative.  Fact Sheet for Patients:  BloggerCourse.comhttps://www.fda.gov/media/152166/download  Fact Sheet for Healthcare Providers:  SeriousBroker.ithttps://www.fda.gov/media/152162/download  This test is no t yet approved or cleared by the [place] FDA and  has been authorized for detection and/or diagnosis of SARS-CoV-2 by FDA under an Emergency Use Authorization (EUA). This EUA will remain  in effect (meaning this test can be used) for the duration of the COVID-19 declaration under Section 564(b)(1) of the Act, 21 U.S.C.section 360bbb-3(b)(1), unless the authorization is terminated  or revoked sooner.       Influenza A by PCR NEGATIVE NEGATIVE Final   Influenza B by PCR NEGATIVE NEGATIVE Final    Comment: (NOTE) The Xpert Xpress SARS-CoV-2/FLU/RSV plus assay is intended as an aid in the diagnosis of influenza from Nasopharyngeal swab specimens and should not be used as a sole basis for treatment. Nasal washings and aspirates are unacceptable for Xpert Xpress SARS-CoV-2/FLU/RSV testing.  Fact  Sheet for Patients: BloggerCourse.comhttps://www.fda.gov/media/152166/download  Fact Sheet for Healthcare Providers: SeriousBroker.ithttps://www.fda.gov/media/152162/download  This test is not yet approved or cleared by the [place] FDA and has been authorized for detection and/or diagnosis of SARS-CoV-2 by FDA under an Emergency Use Authorization (EUA). This EUA will remain in effect (meaning this test can be used) for the duration of the COVID-19 declaration under Section 564(b)(1) of  the Act, 21 U.S.C. section 360bbb-3(b)(1), unless the authorization is terminated or revoked.  Performed at [facility], [address], [place], [place] [address]          Radiology Studies: No results found.      Scheduled Meds:  amLODipine  10 mg Oral Daily   busPIRone  5 mg Oral BID   carvedilol  3.125 mg Oral BID WC   clopidogrel  75 mg Oral QHS   enoxaparin (LOVENOX) injection  30 mg Subcutaneous Daily   ferrous sulfate  325 mg Oral Q lunch   gabapentin  300 mg Oral QHS   irbesartan  300 mg Oral Daily   isosorbide mononitrate  15 mg Oral Daily   levothyroxine  25 mcg Oral QAC breakfast   pantoprazole  40 mg Oral q morning   silver sulfADIAZINE  1 application Topical Daily   sodium chloride flush  3 mL Intravenous Q12H   Continuous Infusions:   LOS: 3 days    Time spent:40 min    Fletcher Rathbun, Roselind Messier, MD [facility]   If 7PM-7AM, please contact night-coverage 11/23/2020, 12:13 PM

## 2020-11-23 NOTE — Progress Notes (Signed)
Physical Therapy Treatment Patient Details Name: Chelsea Moss MRN: [id number] DOB: 07-Jul-1926 Today's Date: 11/23/2020    History of Present Illness 86 y.o. female presents to [facility] ED on 11/19/2020 with confusion, hypertension, and edema. Pt admitted for hypertensive emergency and acute metabolic encephalopathy. PMH includes hypothyroidism, essential hypertension, chronic kidney disease stage IIIb carotid artery disease status post endarterectomy chronic diastolic heart failure.    PT Comments    Pt limited by reports of nausea and fatigue this session. PT does note diastolic BP is low during session, RN made aware. Pt continues to mobilize with limited assistance and will benefit from continued aggressive mobilization to improve activity tolerance and reduce falls risk. PT continues to recommend HHPT and assistance for all out of bed mobility.   Follow Up Recommendations  Home health PT;Supervision/Assistance - 24 hour     Equipment Recommendations  None recommended by PT    Recommendations for Other Services       Precautions / Restrictions Precautions Precautions: Fall Restrictions Weight Bearing Restrictions: No    Mobility  Bed Mobility                    Transfers Overall transfer level: Needs assistance Equipment used: Rolling walker (2 wheeled) Transfers: Sit to/from Stand Sit to Stand: Min assist         General transfer comment: vebal cues for hand placement  Ambulation/Gait Ambulation/Gait assistance: Min guard Gait Distance (Feet): 15 Feet (15' x 2) Assistive device: Rolling walker (2 wheeled) Gait Pattern/deviations: Step-through pattern;Trunk flexed Gait velocity: reduced Gait velocity interpretation: <1.8 ft/sec, indicate of risk for recurrent falls General Gait Details: pt with slowed step-through gait, reduced stride length. Gait tolerance limited by reports of feeling unwell   Stairs             Wheelchair Mobility    Modified  Rankin (Stroke Patients Only)       Balance Overall balance assessment: Needs assistance Sitting-balance support: No upper extremity supported;Feet supported Sitting balance-Leahy Scale: Good     Standing balance support: Single extremity supported;Bilateral upper extremity supported Standing balance-Leahy Scale: Poor Standing balance comment: reliant on UE support of RW                            Cognition Arousal/Alertness: Awake/alert Behavior During Therapy: Anxious Overall Cognitive Status: Impaired/Different from baseline Area of Impairment: Awareness                           Awareness: Emergent          Exercises      General Comments General comments (skin integrity, edema, etc.): BP in sitting 133/43 (67), BP after initial bout of ambulation 117/53 (71), BP after final bout of ambulation 120/35 (58).      Pertinent Vitals/Pain Pain Assessment: No/denies pain    Home Living                      Prior Function            PT Goals (current goals can now be found in the care plan section) Acute Rehab PT Goals Patient Stated Goal: to improve mobility and go home Progress towards PT goals: Not progressing toward goals - comment (limited by nausea/fatigue)    Frequency    Min 3X/week      PT Plan Current plan remains appropriate  Co-evaluation              AM-PAC PT "6 Clicks" Mobility   Outcome Measure  Help needed turning from your back to your side while in a flat bed without using bedrails?: None Help needed moving from lying on your back to sitting on the side of a flat bed without using bedrails?: A Little Help needed moving to and from a bed to a chair (including a wheelchair)?: A Little Help needed standing up from a chair using your arms (e.g., wheelchair or bedside chair)?: A Little Help needed to walk in hospital room?: A Little Help needed climbing 3-5 steps with a railing? : A Lot 6 Click  Score: 18    End of Session   Activity Tolerance: Patient limited by fatigue (nausea) Patient left: in chair;with call bell/phone within reach;with chair alarm set Nurse Communication: Mobility status PT Visit Diagnosis: Unsteadiness on feet (R26.81);Other abnormalities of gait and mobility (R26.89);Muscle weakness (generalized) (M62.81)     Time: 7026-3785 PT Time Calculation (min) (ACUTE ONLY): 23 min  Charges:  $Gait Training: 8-22 mins $Therapeutic Activity: 8-22 mins                     Arlyss Gandy, PT, DPT Acute Rehabilitation Pager: [phone]    Arlyss Gandy 11/23/2020, 2:53 PM

## 2020-11-24 DIAGNOSIS — E7800 Pure hypercholesterolemia, unspecified: Secondary | ICD-10-CM

## 2020-11-24 LAB — CBC WITH DIFFERENTIAL/PLATELET
Abs Immature Granulocytes: 0.03 10*3/uL (ref 0.00–0.07)
Basophils Absolute: 0 10*3/uL (ref 0.0–0.1)
Basophils Relative: 0 %
Eosinophils Absolute: 0.2 10*3/uL (ref 0.0–0.5)
Eosinophils Relative: 2 %
HCT: 28.6 % — ABNORMAL LOW (ref 36.0–46.0)
Hemoglobin: 9.7 g/dL — ABNORMAL LOW (ref 12.0–15.0)
Immature Granulocytes: 0 %
Lymphocytes Relative: 13 %
Lymphs Abs: 1.3 10*3/uL (ref 0.7–4.0)
MCH: 33.3 pg (ref 26.0–34.0)
MCHC: 33.9 g/dL (ref 30.0–36.0)
MCV: 98.3 fL (ref 80.0–100.0)
Monocytes Absolute: 0.9 10*3/uL (ref 0.1–1.0)
Monocytes Relative: 9 %
Neutro Abs: 7.4 10*3/uL (ref 1.7–7.7)
Neutrophils Relative %: 76 %
Platelets: 311 10*3/uL (ref 150–400)
RBC: 2.91 MIL/uL — ABNORMAL LOW (ref 3.87–5.11)
RDW: 13 % (ref 11.5–15.5)
WBC: 9.9 10*3/uL (ref 4.0–10.5)
nRBC: 0 % (ref 0.0–0.2)

## 2020-11-24 LAB — COMPREHENSIVE METABOLIC PANEL
ALT: 15 U/L (ref 0–44)
AST: 23 U/L (ref 15–41)
Albumin: 2.9 g/dL — ABNORMAL LOW (ref 3.5–5.0)
Alkaline Phosphatase: 41 U/L (ref 38–126)
Anion gap: 7 (ref 5–15)
BUN: 19 mg/dL (ref 8–23)
CO2: 30 mmol/L (ref 22–32)
Calcium: 8.8 mg/dL — ABNORMAL LOW (ref 8.9–10.3)
Chloride: 97 mmol/L — ABNORMAL LOW (ref 98–111)
Creatinine, Ser: 1.14 mg/dL — ABNORMAL HIGH (ref 0.44–1.00)
GFR, Estimated: 45 mL/min — ABNORMAL LOW (ref >60)
Glucose, Bld: 98 mg/dL (ref 70–99)
Potassium: 3.5 mmol/L (ref 3.5–5.1)
Sodium: 134 mmol/L — ABNORMAL LOW (ref 135–145)
Total Bilirubin: 0.8 mg/dL (ref 0.3–1.2)
Total Protein: 5.7 g/dL — ABNORMAL LOW (ref 6.5–8.1)

## 2020-11-24 LAB — CULTURE, BLOOD (ROUTINE X 2)
Culture: NO GROWTH
Culture: NO GROWTH
Special Requests: ADEQUATE
Special Requests: ADEQUATE

## 2020-11-24 LAB — PHOSPHORUS: Phosphorus: 3 mg/dL (ref 2.5–4.6)

## 2020-11-24 LAB — MAGNESIUM: Magnesium: 2.1 mg/dL (ref 1.7–2.4)

## 2020-11-24 MED ORDER — ISOSORBIDE MONONITRATE ER 30 MG PO TB24
15.0000 mg | ORAL_TABLET | Freq: Once | ORAL | Status: AC
  Administered 2020-11-24: 15 mg via ORAL
  Filled 2020-11-24: qty 1

## 2020-11-24 MED ORDER — ISOSORBIDE MONONITRATE ER 30 MG PO TB24
45.0000 mg | ORAL_TABLET | Freq: Every day | ORAL | Status: DC
  Administered 2020-11-25: 45 mg via ORAL
  Filled 2020-11-24: qty 2

## 2020-11-24 MED ORDER — SILVER SULFADIAZINE 1 % EX CREA
TOPICAL_CREAM | Freq: Every day | CUTANEOUS | Status: AC
  Filled 2020-11-24: qty 85

## 2020-11-24 NOTE — Consult Note (Signed)
WOC Nurse wound consult note Consultation was completed by review of records, images and assistance from the bedside nurse/clinical staff.  Patient is followed by podiatry outpatient (Dr. Marylene Land) for the same Reason for Consult: right middle toe Wound type: full thickness ulceration Pressure Injury POA: NA Measurement: last measured by Dr. Marylene Land; 0.3cm x 0.2cm x 0.1cm (post excisional debridement) Dressing procedure/placement/frequency:  Continue POC established by podiatrist   Silvadene daily and dry dressing.   Re consult if needed, will not follow at this time. Thanks  Niesha Bame [employer], RN,CWOCN, CNS, CWON-AP [phone])

## 2020-11-24 NOTE — Progress Notes (Signed)
PROGRESS NOTE    Chelsea Moss  WUJ:[id number] DOB: 06/05/1926 DOA: 11/19/2020 PCP: Rolm Gala, NP     Brief Narrative:  Chelsea Moss is an 87 y.o. WF PMHx Hypothyroidism, essential HTN, CAD, chronic diastolic CHF, CAD s/p  endarterectomy, CKD stage IIIb,   Presents to the ED with confusion elevated blood pressure and edema in the ED she was found to have a blood pressure systolic of 190 sodium 131, creatinine 1.3, mild leukocytosis with left shift lactic acid 1.1   Subjective: 7/7 afebrile overnight A/O x4, patient having back pain overnight secondary to RNs having to move patient around.   Assessment & Plan: Covid vaccination; vaccinated 1/3.  States does not want remainder of vaccinations because she is 87 years old.   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 -2D echo was done that showed a preserved EF grade 1 diastolic heart failure no wall motion abnormalities. -Physical therapy evaluated the patient recommended home health PT.   Acute metabolic encephalopathy: Has had recurrent hypothermia Cytogeneticist.PRN   Leukocytosis: Resolved likely stress margination.   Iron deficiency anemia: Hemoglobin dropped 11. To 8.9, no signs of overt bleeding recheck a CBC today.  Hypothyroidism: Continue Synthroid.  Acute on CKD Stage IIIb: (Baseline Cr 1.3) Lab Results  Component Value Date   CREATININE 1.14 (H) 11/24/2020   CREATININE 1.38 (H) 11/23/2020   CREATININE 1.51 (H) 11/22/2020    CREATININE 1.32 (H) 11/20/2020   CREATININE 1.23 (H) 11/19/2020  -Most likely secondary to overdiuresis  Chronic back pain - Normally on Diclofenac 75 mg BID, not ordered I am assuming secondary to her acute on CKD stage IIIb.  Would restart once patient's renal function returns to normal. - 7/6 tramadol 50 mg PRN   History of CVA: Continue statin and Plavix.        DVT prophylaxis:  Code Status:  Family Communication: 7/7 spoke with daughter Kipp Laurence discussed plan of care answered all questions Status is: Inpatient    Dispo: The patient is from:               Anticipated d/c is to:               Anticipated d/c date is:               Patient currently       Consultants:    Procedures/Significant Events:    I have personally reviewed and interpreted all radiology studies and my findings are as above.  VENTILATOR SETTINGS:    Cultures   Antimicrobials:    Devices    LINES / TUBES:      Continuous Infusions:   Objective: Vitals:   11/24/20 0700 11/24/20 0839 11/24/20 0933 11/24/20 1200  BP: (!) 145/59 (!) 191/52 (!) 153/59   Pulse: 74 77 70 72  Resp: 12 18 15    Temp:    (!) 97.3 F (36.3 C)  TempSrc:    Oral  SpO2: 94% 94% 93% 94%  Weight:  Height:        Intake/Output Summary (Last 24 hours) at 11/24/2020 1358 Last data filed at 11/24/2020 1300 Gross per 24 hour  Intake 840 ml  Output 150 ml  Net 690 ml    Filed Weights   11/22/20 0100 11/23/20 0334 11/24/20 0400  Weight: 69.3 kg 69.3 kg 68.8 kg    Examination:  General: A/O x4, No acute respiratory distress Eyes: negative scleral hemorrhage, negative anisocoria, negative icterus ENT: Negative Runny nose, negative gingival bleeding, Neck:  Negative scars, masses, torticollis, lymphadenopathy, JVD Lungs: Clear to auscultation bilaterally without wheezes or crackles Cardiovascular: Regular rate and rhythm without murmur gallop or rub normal S1 and S2 Abdomen: negative  abdominal pain, nondistended, positive soft, bowel sounds, no rebound, no ascites, no appreciable mass Extremities: No significant cyanosis, clubbing, or edema bilateral lower extremities Musculoskeletal; pain to palpation lower back. Skin: Negative rashes, lesions, ulcers Psychiatric:  Negative depression, negative anxiety, negative fatigue, negative mania  Central nervous system:  Cranial nerves II through XII intact, tongue/uvula midline, all extremities muscle strength 5/5, sensation intact throughout, negative dysarthria, negative expressive aphasia, negative receptive aphasia.  .     Data Reviewed: Care during the described time interval was provided by me .  I have reviewed this patient's available data, including medical history, events of note, physical examination, and all test results as part of my evaluation.  CBC: Recent Labs  Lab 11/19/20 1442 11/20/20 0340 11/21/20 0940 11/23/20 1436 11/24/20 0353  WBC 11.4* 11.5* 7.7 10.4 9.9  NEUTROABS 10.0*  --  5.7 8.9* 7.4  HGB 11.3* 8.9* 9.8* 10.5* 9.7*  HCT 33.7* 25.5* 29.0* 30.8* 28.6*  MCV 97.7 97.3 98.3 97.8 98.3  PLT 369 269 285 354 311    Basic Metabolic Panel: Recent Labs  Lab 11/19/20 1442 11/20/20 0340 11/22/20 0233 11/23/20 1436 11/24/20 0353  NA 131* 132* 134* 133* 134*  K 4.2 4.3 4.2 3.7 3.5  CL 98 97* 95* 95* 97*  CO2 22 26 29 28 30   GLUCOSE 181* 103* 106* 187* 98  BUN 25* 22 24* 21 19  CREATININE 1.23* 1.32* 1.51* 1.38* 1.14*  CALCIUM 9.8 9.1 8.9 8.8* 8.8*  MG  --   --   --  1.9 2.1  PHOS  --   --   --  2.8 3.0    GFR: Estimated Creatinine Clearance: 26.5 mL/min (A) (by C-G formula based on SCr of 1.14 mg/dL (H)). Liver Function Tests: Recent Labs  Lab 11/19/20 1442 11/20/20 0340 11/23/20 1436 11/24/20 0353  AST 27 25 23 23   ALT 18 15 15 15   ALKPHOS 55 38 46 41  BILITOT 0.5 0.8 0.7 0.8  PROT 6.7 5.2* 6.0* 5.7*  ALBUMIN 3.7 2.9* 3.1* 2.9*    No results for input(s): LIPASE, AMYLASE in  the last 168 hours. No results for input(s): AMMONIA in the last 168 hours. Coagulation Profile: No results for input(s): INR, PROTIME in the last 168 hours. Cardiac Enzymes: No results for input(s): CKTOTAL, CKMB, CKMBINDEX, TROPONINI in the last 168 hours. BNP (last 3 results) No results for input(s): PROBNP in the last 8760 hours. HbA1C: No results for input(s): HGBA1C in the last 72 hours. CBG: Recent Labs  Lab 11/19/20 1439  GLUCAP 182*    Lipid Profile: No results for input(s): CHOL, HDL, LDLCALC, TRIG, CHOLHDL, LDLDIRECT in the last 72 hours. Thyroid Function Tests: No results for input(s): TSH, T4TOTAL, FREET4, T3FREE, THYROIDAB in the last 72 hours. Anemia Panel: No results  for input(s): VITAMINB12, FOLATE, FERRITIN, TIBC, IRON, RETICCTPCT in the last 72 hours. Sepsis Labs: Recent Labs  Lab 11/19/20 1729 11/19/20 1929  LATICACIDVEN 1.4 1.1     Recent Results (from the past 240 hour(s))  Urine culture     Status: None   Collection Time: 11/19/20  2:42 PM   Specimen: Urine, Random  Result Value Ref Range Status   Specimen Description URINE, RANDOM  Final   Special Requests NONE  Final   Culture   Final    NO GROWTH Performed at [facility], [address], [place], [place] [address]    Report Status 11/21/2020 FINAL  Final  Blood culture (routine x 2)     Status: None   Collection Time: 11/19/20  6:25 PM   Specimen: BLOOD  Result Value Ref Range Status   Specimen Description BLOOD SITE NOT SPECIFIED  Final   Special Requests   Final    BOTTLES DRAWN AEROBIC AND ANAEROBIC Blood Culture adequate volume   Culture   Final    NO GROWTH 5 DAYS Performed at [facility], [address] [place], [place] [address]    Report Status 11/24/2020 FINAL  Final  Blood culture (routine x 2)     Status: None   Collection Time: 11/19/20  6:26 PM   Specimen: BLOOD  Result Value Ref Range Status   Specimen Description BLOOD SITE NOT SPECIFIED  Final    Special Requests   Final    BOTTLES DRAWN AEROBIC AND ANAEROBIC Blood Culture adequate volume   Culture   Final    NO GROWTH 5 DAYS Performed at [facility], [address] [place], [place] [address]    Report Status 11/24/2020 FINAL  Final  Resp Panel by RT-PCR (Flu A&B, Covid) Nasopharyngeal Swab     Status: None   Collection Time: 11/19/20  6:26 PM   Specimen: Nasopharyngeal Swab; Nasopharyngeal(NP) swabs in vial transport medium  Result Value Ref Range Status   SARS Coronavirus 2 by RT PCR NEGATIVE NEGATIVE Final    Comment: (NOTE) SARS-CoV-2 target nucleic acids are NOT DETECTED.  The SARS-CoV-2 RNA is generally detectable in upper respiratory specimens during the acute phase of infection. The lowest concentration of SARS-CoV-2 viral copies this assay can detect is 138 copies/mL. A negative result does not preclude SARS-Cov-2 infection and should not be used as the sole basis for treatment or other patient management decisions. A negative result may occur with  improper specimen collection/handling, submission of specimen other than nasopharyngeal swab, presence of viral mutation(s) within the areas targeted by this assay, and inadequate number of viral copies(<138 copies/mL). A negative result must be combined with clinical observations, patient history, and epidemiological information. The expected result is Negative.  Fact Sheet for Patients:  BloggerCourse.com  Fact Sheet for Healthcare Providers:  SeriousBroker.it  This test is no t yet approved or cleared by the [place] FDA and  has been authorized for detection and/or diagnosis of SARS-CoV-2 by FDA under an Emergency Use Authorization (EUA). This EUA will remain  in effect (meaning this test can be used) for the duration of the COVID-19 declaration under Section 564(b)(1) of the Act, 21 U.S.C.section 360bbb-3(b)(1), unless the authorization is terminated   or revoked sooner.       Influenza A by PCR NEGATIVE NEGATIVE Final   Influenza B by PCR NEGATIVE NEGATIVE Final    Comment: (NOTE) The Xpert Xpress SARS-CoV-2/FLU/RSV plus assay is intended as  an aid in the diagnosis of influenza from Nasopharyngeal swab specimens and should not be used as a sole basis for treatment. Nasal washings and aspirates are unacceptable for Xpert Xpress SARS-CoV-2/FLU/RSV testing.  Fact Sheet for Patients: BloggerCourse.com  Fact Sheet for Healthcare Providers: SeriousBroker.it  This test is not yet approved or cleared by the [place] FDA and has been authorized for detection and/or diagnosis of SARS-CoV-2 by FDA under an Emergency Use Authorization (EUA). This EUA will remain in effect (meaning this test can be used) for the duration of the COVID-19 declaration under Section 564(b)(1) of the Act, 21 U.S.C. section 360bbb-3(b)(1), unless the authorization is terminated or revoked.  Performed at [facility], [address], [place], [place] [address]           Radiology Studies: No results found.      Scheduled Meds:  amLODipine  10 mg Oral Daily   busPIRone  5 mg Oral BID   carvedilol  3.125 mg Oral BID WC   clopidogrel  75 mg Oral QHS   enoxaparin (LOVENOX) injection  30 mg Subcutaneous Daily   ferrous sulfate  325 mg Oral Q lunch   gabapentin  300 mg Oral QHS   irbesartan  300 mg Oral Daily   isosorbide mononitrate  30 mg Oral Daily   levothyroxine  25 mcg Oral QAC breakfast   pantoprazole  40 mg Oral q morning   silver sulfADIAZINE   Topical Daily   sodium chloride flush  3 mL Intravenous Q12H   Continuous Infusions:   LOS: 4 days    Time spent:40 min    Bianco Cange, Roselind Messier, MD [facility]   If 7PM-7AM, please contact night-coverage 11/24/2020, 1:58 PM

## 2020-11-25 LAB — CBC WITH DIFFERENTIAL/PLATELET
Abs Immature Granulocytes: 0.04 10*3/uL (ref 0.00–0.07)
Basophils Absolute: 0 10*3/uL (ref 0.0–0.1)
Basophils Relative: 0 %
Eosinophils Absolute: 0.1 10*3/uL (ref 0.0–0.5)
Eosinophils Relative: 1 %
HCT: 30.4 % — ABNORMAL LOW (ref 36.0–46.0)
Hemoglobin: 10.3 g/dL — ABNORMAL LOW (ref 12.0–15.0)
Immature Granulocytes: 0 %
Lymphocytes Relative: 6 %
Lymphs Abs: 0.7 10*3/uL (ref 0.7–4.0)
MCH: 32.8 pg (ref 26.0–34.0)
MCHC: 33.9 g/dL (ref 30.0–36.0)
MCV: 96.8 fL (ref 80.0–100.0)
Monocytes Absolute: 0.8 10*3/uL (ref 0.1–1.0)
Monocytes Relative: 7 %
Neutro Abs: 10 10*3/uL — ABNORMAL HIGH (ref 1.7–7.7)
Neutrophils Relative %: 86 %
Platelets: 306 10*3/uL (ref 150–400)
RBC: 3.14 MIL/uL — ABNORMAL LOW (ref 3.87–5.11)
RDW: 12.9 % (ref 11.5–15.5)
WBC: 11.8 10*3/uL — ABNORMAL HIGH (ref 4.0–10.5)
nRBC: 0 % (ref 0.0–0.2)

## 2020-11-25 LAB — COMPREHENSIVE METABOLIC PANEL
ALT: 15 U/L (ref 0–44)
AST: 22 U/L (ref 15–41)
Albumin: 2.9 g/dL — ABNORMAL LOW (ref 3.5–5.0)
Alkaline Phosphatase: 46 U/L (ref 38–126)
Anion gap: 8 (ref 5–15)
BUN: 19 mg/dL (ref 8–23)
CO2: 27 mmol/L (ref 22–32)
Calcium: 8.8 mg/dL — ABNORMAL LOW (ref 8.9–10.3)
Chloride: 98 mmol/L (ref 98–111)
Creatinine, Ser: 1.06 mg/dL — ABNORMAL HIGH (ref 0.44–1.00)
GFR, Estimated: 49 mL/min — ABNORMAL LOW (ref >60)
Glucose, Bld: 113 mg/dL — ABNORMAL HIGH (ref 70–99)
Potassium: 4.1 mmol/L (ref 3.5–5.1)
Sodium: 133 mmol/L — ABNORMAL LOW (ref 135–145)
Total Bilirubin: 0.7 mg/dL (ref 0.3–1.2)
Total Protein: 5.9 g/dL — ABNORMAL LOW (ref 6.5–8.1)

## 2020-11-25 LAB — PHOSPHORUS: Phosphorus: 2.6 mg/dL (ref 2.5–4.6)

## 2020-11-25 LAB — MAGNESIUM: Magnesium: 2 mg/dL (ref 1.7–2.4)

## 2020-11-25 MED ORDER — HYDRALAZINE HCL 10 MG PO TABS
5.0000 mg | ORAL_TABLET | Freq: Three times a day (TID) | ORAL | Status: DC
  Administered 2020-11-25 – 2020-11-29 (×10): 5 mg via ORAL
  Filled 2020-11-25 (×10): qty 1

## 2020-11-25 MED ORDER — HYDRALAZINE HCL 20 MG/ML IJ SOLN
10.0000 mg | Freq: Once | INTRAMUSCULAR | Status: AC
  Administered 2020-11-25: 10 mg via INTRAVENOUS

## 2020-11-25 MED ORDER — TRAMADOL HCL 50 MG PO TABS
50.0000 mg | ORAL_TABLET | Freq: Four times a day (QID) | ORAL | Status: DC | PRN
  Administered 2020-11-25 – 2020-11-26 (×4): 50 mg via ORAL
  Filled 2020-11-25 (×4): qty 1

## 2020-11-25 MED ORDER — ISOSORBIDE MONONITRATE ER 60 MG PO TB24
60.0000 mg | ORAL_TABLET | Freq: Every day | ORAL | Status: DC
  Administered 2020-11-26 – 2020-11-29 (×4): 60 mg via ORAL
  Filled 2020-11-25 (×4): qty 1

## 2020-11-25 NOTE — Plan of Care (Signed)
  Problem: Nutrition: Goal: Adequate nutrition will be maintained Outcome: Completed/Met   Problem: Pain Managment: Goal: General experience of comfort will improve Outcome: Completed/Met
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
## 2020-11-25 NOTE — Care Management Important Message (Signed)
Important Message  Patient Details  Name: Chelsea Moss MRN: [id number] Date of Birth: [birth year]   Medicare Important Message Given:  Yes     Renie Ora 11/25/2020, 8:09 AM

## 2020-11-25 NOTE — Progress Notes (Signed)
   11/25/20 0002  Assess: MEWS Score  BP (!) 207/57  Resp 18  O2 Device Room Air  O2 Flow Rate (L/min) 96 L/min  Assess: MEWS Score  MEWS Temp 0  MEWS Systolic 2  MEWS Pulse 0  MEWS RR 0  MEWS LOC 0  MEWS Score 2  MEWS Score Color Yellow  Assess: if the MEWS score is Yellow or Red  Were vital signs taken at a resting state? Yes  Focused Assessment Change from prior assessment (see assessment flowsheet)  Early Detection of Sepsis Score *See Row Information* Low  MEWS guidelines implemented *See Row Information* No, other (Comment) (BP's have been high since admission, meds will be given and BP will be rechecked)  Document  Patient Outcome Stabilized after interventions  Progress note created (see row info) Yes

## 2020-11-25 NOTE — Progress Notes (Signed)
Pt's BP was 207/57, pt was given 5 mg IV Hydralzine per order, RN rechecked BP went to 191/51, RN notified MD and he ordered 1 x dose of IV Hydralazine 10 mg to help with this.  RN rechecked BP went to 155/48, will continue to monitor, Thanks Lavonda Jumbo RN.

## 2020-11-25 NOTE — Progress Notes (Signed)
   11/25/20 0200  Assess: MEWS Score  Resp 18  O2 Device Room Air  Assess: MEWS Score  MEWS Temp 0  MEWS Systolic 2  MEWS Pulse 0  MEWS RR 0  MEWS LOC 0  MEWS Score 2  MEWS Score Color Yellow  Assess: if the MEWS score is Yellow or Red  Were vital signs taken at a resting state? Yes  Focused Assessment No change from prior assessment  Early Detection of Sepsis Score *See Row Information* Low  MEWS guidelines implemented *See Row Information* No, other (Comment) (MD ordered add meds to help)  Document  Patient Outcome Stabilized after interventions  Progress note created (see row info) Yes

## 2020-11-26 ENCOUNTER — Inpatient Hospital Stay (HOSPITAL_COMMUNITY): Payer: Medicare Other

## 2020-11-26 DIAGNOSIS — R404 Transient alteration of awareness: Secondary | ICD-10-CM

## 2020-11-26 DIAGNOSIS — Z8673 Personal history of transient ischemic attack (TIA), and cerebral infarction without residual deficits: Secondary | ICD-10-CM

## 2020-11-26 LAB — COMPREHENSIVE METABOLIC PANEL
ALT: 17 U/L (ref 0–44)
AST: 21 U/L (ref 15–41)
Albumin: 2.9 g/dL — ABNORMAL LOW (ref 3.5–5.0)
Alkaline Phosphatase: 52 U/L (ref 38–126)
Anion gap: 8 (ref 5–15)
BUN: 22 mg/dL (ref 8–23)
CO2: 27 mmol/L (ref 22–32)
Calcium: 8.8 mg/dL — ABNORMAL LOW (ref 8.9–10.3)
Chloride: 97 mmol/L — ABNORMAL LOW (ref 98–111)
Creatinine, Ser: 1.07 mg/dL — ABNORMAL HIGH (ref 0.44–1.00)
GFR, Estimated: 49 mL/min — ABNORMAL LOW (ref >60)
Glucose, Bld: 100 mg/dL — ABNORMAL HIGH (ref 70–99)
Potassium: 3.9 mmol/L (ref 3.5–5.1)
Sodium: 132 mmol/L — ABNORMAL LOW (ref 135–145)
Total Bilirubin: 0.8 mg/dL (ref 0.3–1.2)
Total Protein: 6 g/dL — ABNORMAL LOW (ref 6.5–8.1)

## 2020-11-26 LAB — CBC WITH DIFFERENTIAL/PLATELET
Abs Immature Granulocytes: 0.05 10*3/uL (ref 0.00–0.07)
Basophils Absolute: 0 10*3/uL (ref 0.0–0.1)
Basophils Relative: 0 %
Eosinophils Absolute: 0.1 10*3/uL (ref 0.0–0.5)
Eosinophils Relative: 1 %
HCT: 28.6 % — ABNORMAL LOW (ref 36.0–46.0)
Hemoglobin: 9.6 g/dL — ABNORMAL LOW (ref 12.0–15.0)
Immature Granulocytes: 1 %
Lymphocytes Relative: 12 %
Lymphs Abs: 1.2 10*3/uL (ref 0.7–4.0)
MCH: 33 pg (ref 26.0–34.0)
MCHC: 33.6 g/dL (ref 30.0–36.0)
MCV: 98.3 fL (ref 80.0–100.0)
Monocytes Absolute: 1.3 10*3/uL — ABNORMAL HIGH (ref 0.1–1.0)
Monocytes Relative: 12 %
Neutro Abs: 7.9 10*3/uL — ABNORMAL HIGH (ref 1.7–7.7)
Neutrophils Relative %: 74 %
Platelets: 296 10*3/uL (ref 150–400)
RBC: 2.91 MIL/uL — ABNORMAL LOW (ref 3.87–5.11)
RDW: 12.8 % (ref 11.5–15.5)
WBC: 10.6 10*3/uL — ABNORMAL HIGH (ref 4.0–10.5)
nRBC: 0 % (ref 0.0–0.2)

## 2020-11-26 LAB — MAGNESIUM: Magnesium: 2.1 mg/dL (ref 1.7–2.4)

## 2020-11-26 LAB — PHOSPHORUS: Phosphorus: 3.4 mg/dL (ref 2.5–4.6)

## 2020-11-26 IMAGING — MR MR MRA HEAD W/O CM
1 series · 18 of 48 positions shown · non-contrast
Comparison: Comparison made with corresponding brain MRI from the
same day.

CLINICAL DATA: Follow-up examination for stroke.

EXAM:
MRA HEAD WITHOUT CONTRAST
TECHNIQUE: Angiographic images of the Circle of Willis were acquired using MRA
technique without intravenous contrast.

[Series 5: 3d cow · axial · 0.5mm · 0.41mm/px · z∈[-90,-9]mm · 18 of 172 slices shown]
[im 1/172]
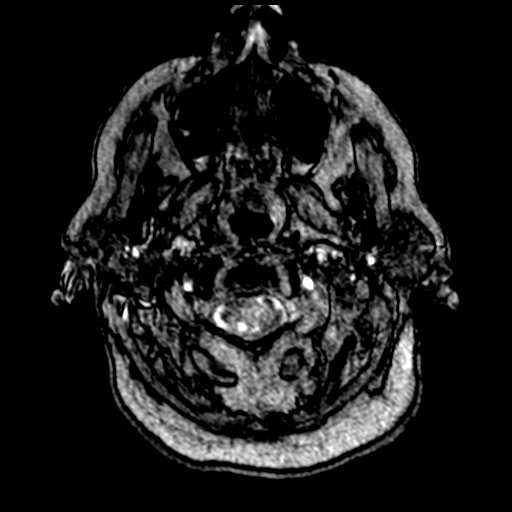
[im 4/172]
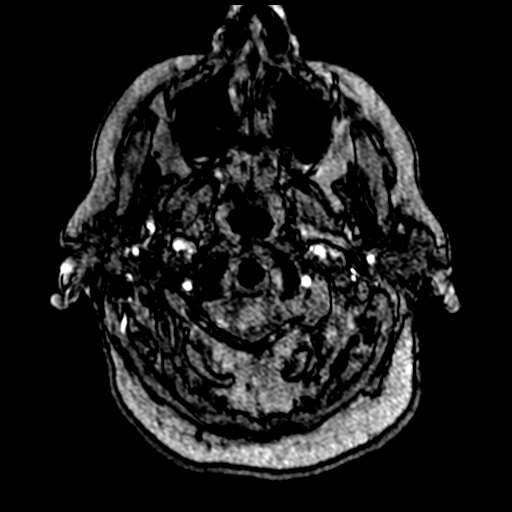
[im 8/172]
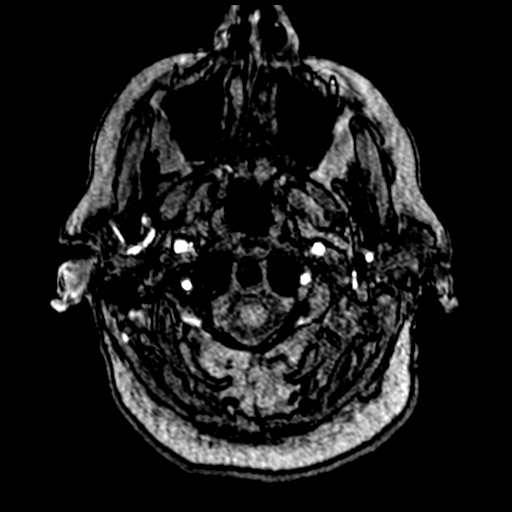
[im 11/172]
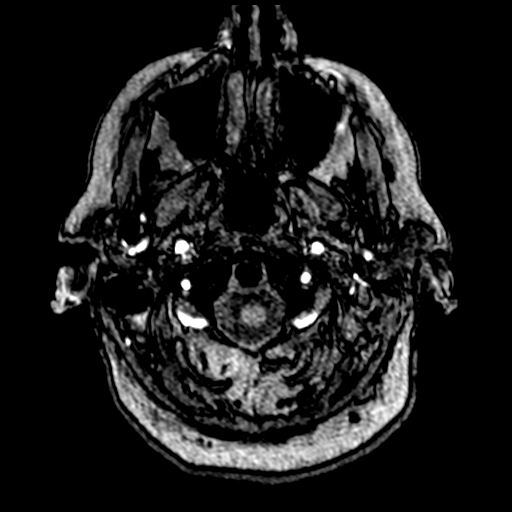
[im 15/172]
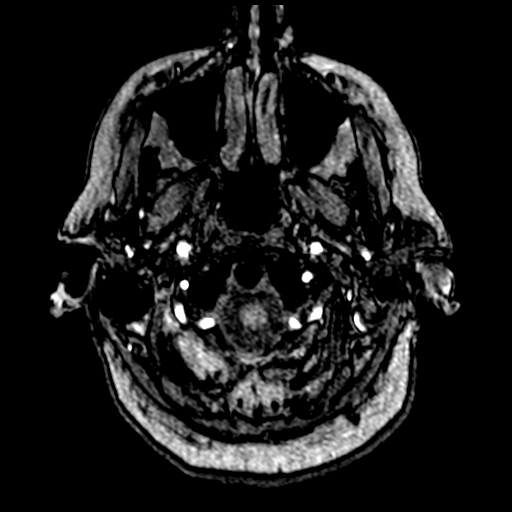
[im 19/172]
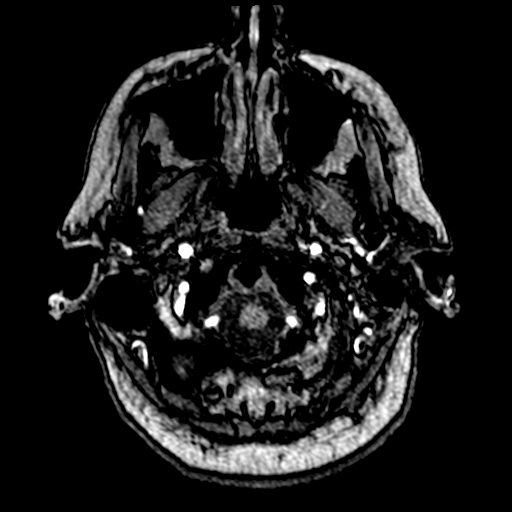
[im 22/172]
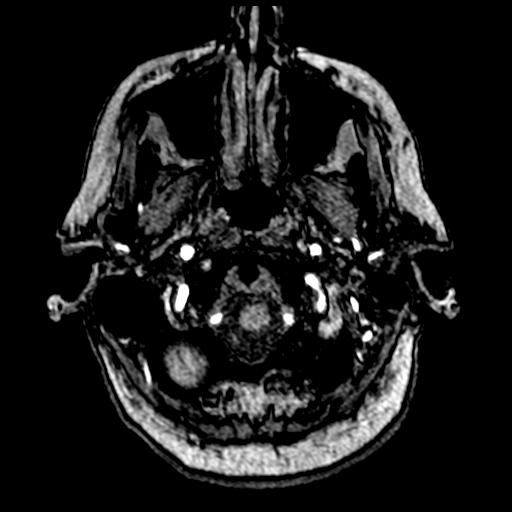
[im 26/172]
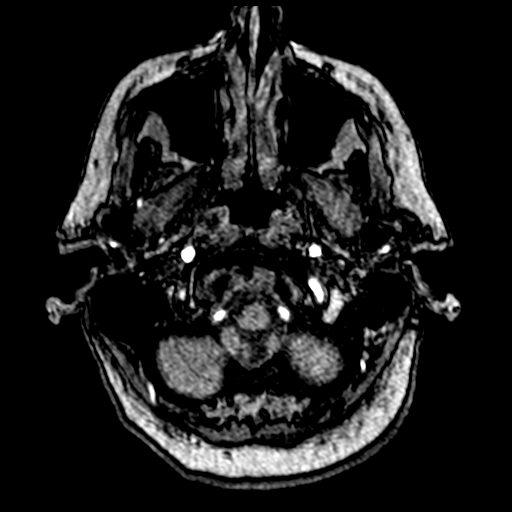
[im 30/172]
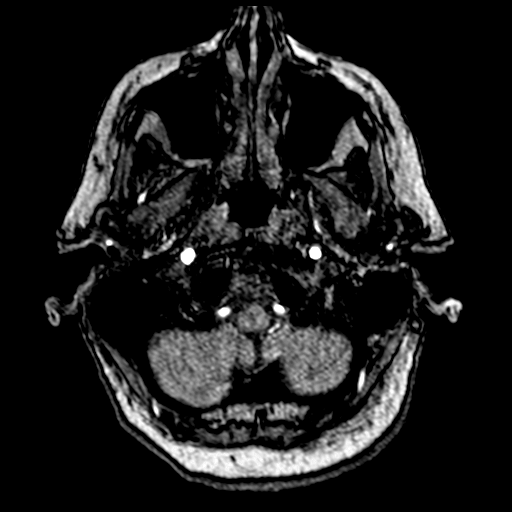
[im 33/172]
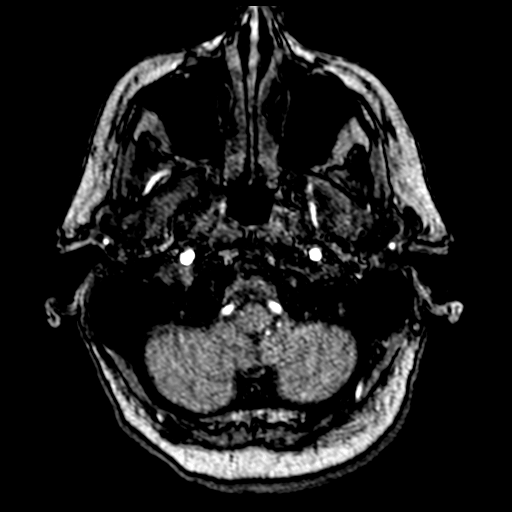
[im 55/172]
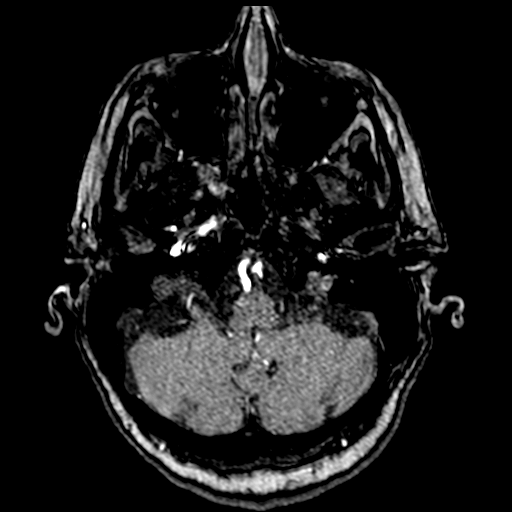
[im 77/172]
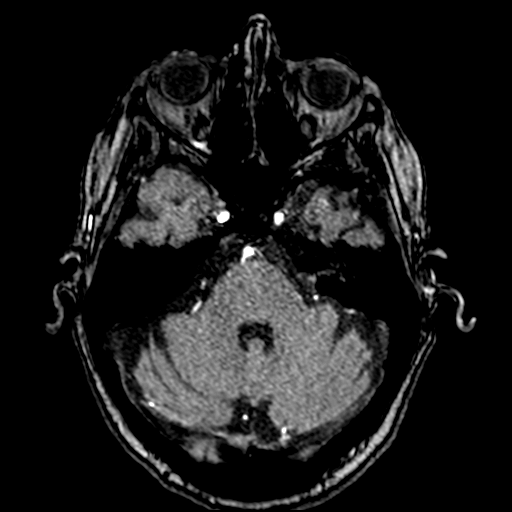
[im 88/172]
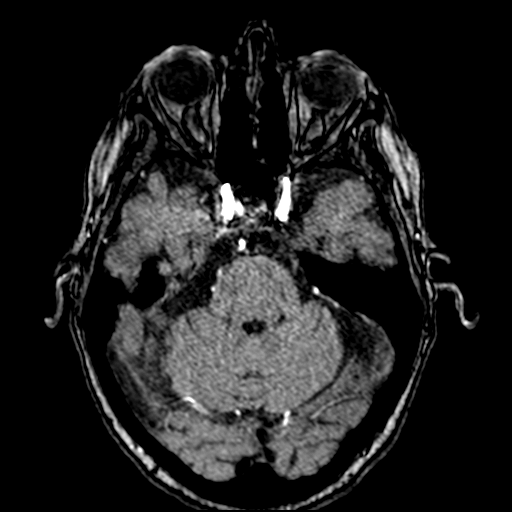
[im 99/172]
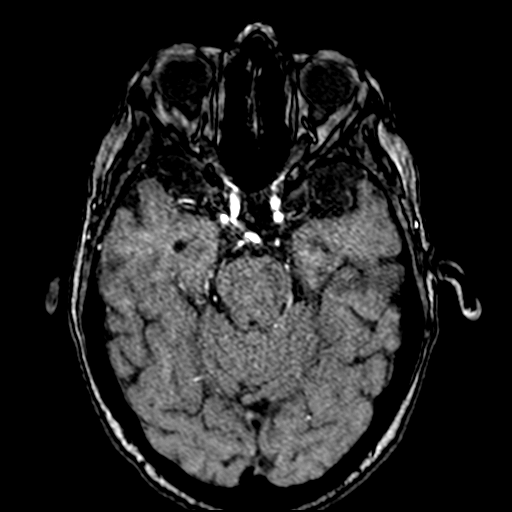
[im 121/172]
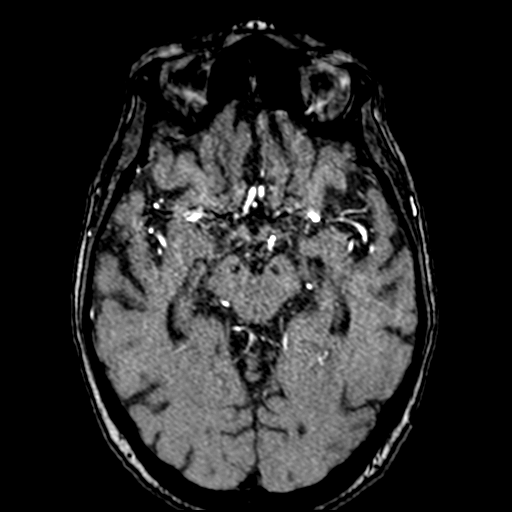
[im 142/172]
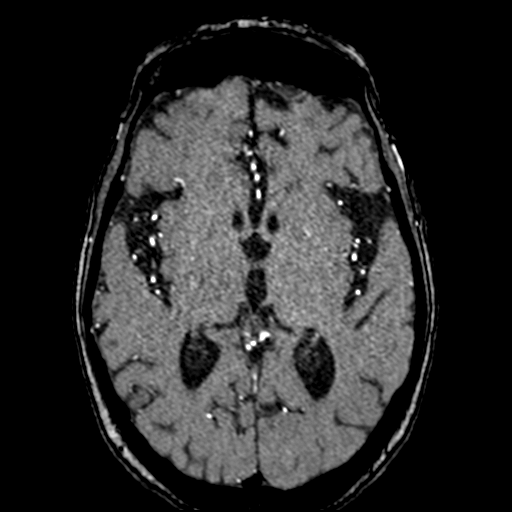
[im 146/172]
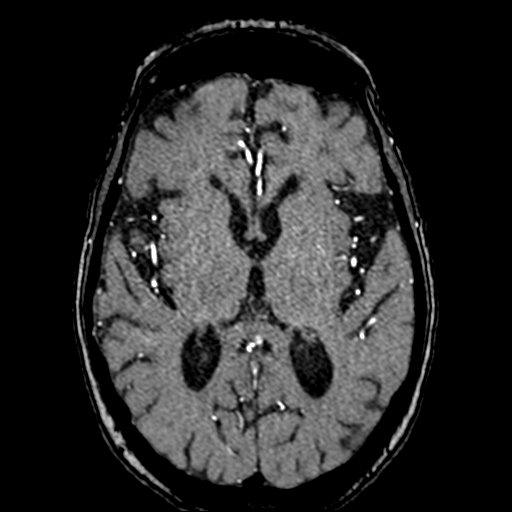
[im 164/172]
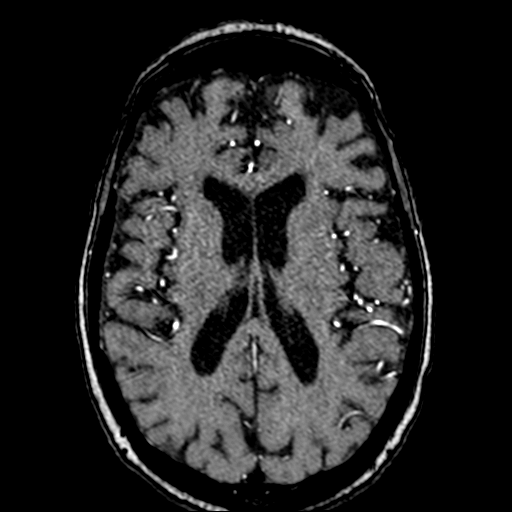

[18 of 48 positions shown; findings below may reference images not displayed]

FINDINGS: Anterior circulation: Examination mildly degraded by motion
artifact.

Visualized distal cervical segments of the internal carotid arteries
are patent with antegrade flow. Petrous segments patent bilaterally.
Atheromatous irregularity seen throughout the carotid siphons with
associated mild to moderate multifocal narrowing. 5 mm focal
outpouching extending posteriorly from the supraclinoid right ICA
consistent with an aneurysm (series 5, image 96). In additional 4 mm
focal outpouching extending posteriorly from the supraclinoid left
ICA also consistent with an aneurysm (series 5, image 99).

A1 segments patent bilaterally. Normal anterior communicating artery
complex. Anterior cerebral arteries patent to their distal aspects
without stenosis.

Left M1 segment widely patent. Short-segment mild to moderate
proximal right M1 stenosis (series [RO], image 9). MCA bifurcations
within normal limits. Distal MCA branches well perfused and
symmetric, although demonstrates small vessel atheromatous
irregularity.

Posterior circulation: Vertebral arteries are largely codominant and
patent to the vertebrobasilar junction without stenosis. Left PICA
origin patent and normal. Right PICA not definitely seen.
Short-segment severe stenosis noted involving the mid basilar artery
(series [RO], image 12). Basilar patent distally. Superior cerebral
arteries patent bilaterally. Left PCA primarily supplied via the
basilar. Right PCA supplied via the basilar as well as a prominent
right posterior communicating artery. Atheromatous irregularity
within the left P1 and P2 segments without high-grade stenosis.
Severe distal left P3 stenosis versus occlusion (series [DATE], image
16). On the right, there is a focal severe proximal right P2
stenosis (series [RO], image 10). Additional severe stenosis versus
occlusion involving the distal right P3 branch (series [RO], image
15).

Anatomic variants: None significant.

Other: None.
IMPRESSION: 1. Negative intracranial MRA for large vessel occlusion.
2. Short-segment severe stenosis involving the mid basilar artery.
3. Severe proximal right P2 stenosis, with additional bilateral
severe distal P3 stenoses versus occlusions as above.
4. Short-segment mild to moderate proximal right M1 stenosis.
5. Two distinct 4-5 mm aneurysms arising from the bilateral
supraclinoid ICAs as above.

## 2020-11-26 IMAGING — MR MR HEAD W/O CM
12 of 13 series · 44 of 48 positions shown · non-contrast
Comparison: CT head [DATE].

CLINICAL DATA: Neuro deficit, acute stroke suspected.

EXAM:
MRI HEAD WITHOUT CONTRAST
TECHNIQUE: Multiplanar, multiecho pulse sequences of the brain and surrounding
structures were obtained without intravenous contrast.

[Series 5: DWI · axial · 3.0mm · 0.88mm/px · z∈[-86,+59]mm · 8 of 100 slices shown (1 of 4)]
[im 1/100]
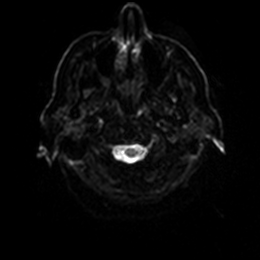
[im 15/100]
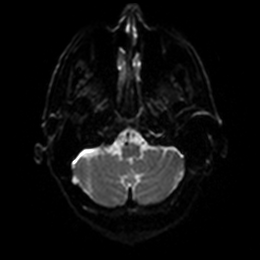
[im 29/100]
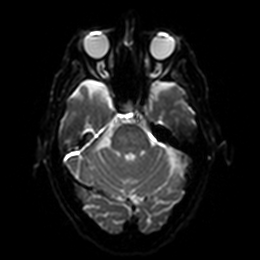
[im 43/100]
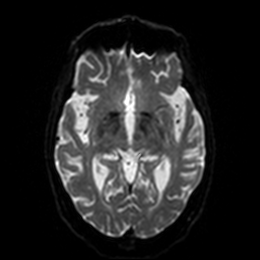
[im 57/100]
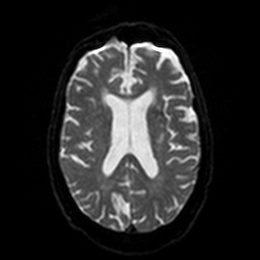
[im 71/100]
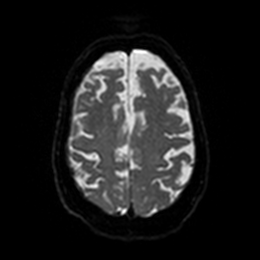
[im 85/100]
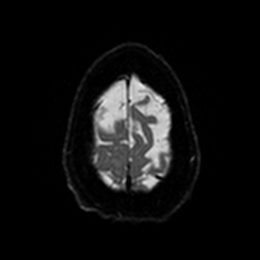
[im 100/100]
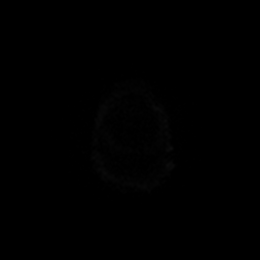

[Series 6: DWI · axial · 3.0mm · 0.88mm/px · z∈[-86,+59]mm · 4 of 50 slices shown (2 of 4)]
[im 1/50]
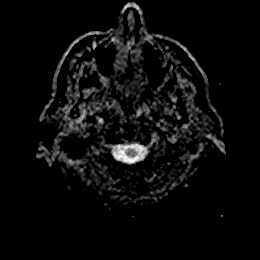
[im 17/50]
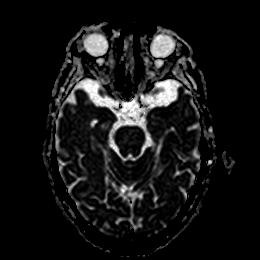
[im 33/50]
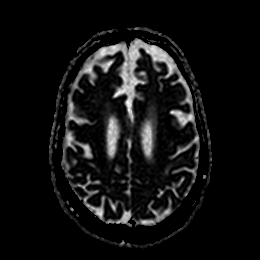
[im 50/50]
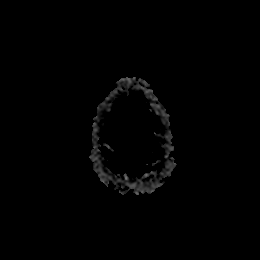

[Series 7: DWI · coronal · 4.0mm · 0.88mm/px · 5 of 72 slices shown (3 of 4)]
[im 1/72]
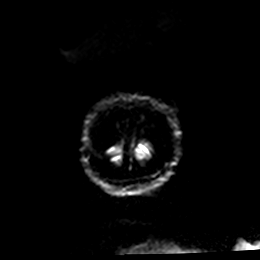
[im 18/72]
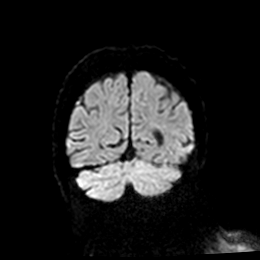
[im 36/72]
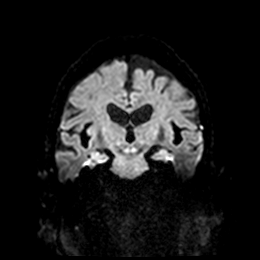
[im 54/72]
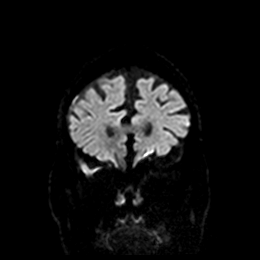
[im 72/72]
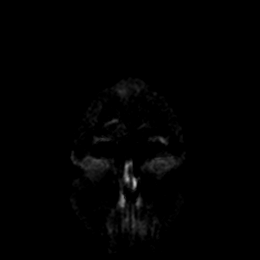

[Series 8: DWI · coronal · 4.0mm · 0.88mm/px · 3 of 36 slices shown (4 of 4)]
[im 1/36]
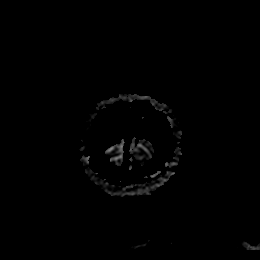
[im 18/36]
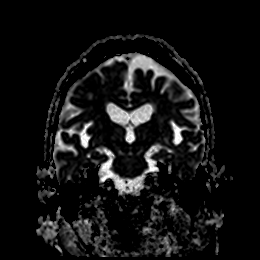
[im 36/36]
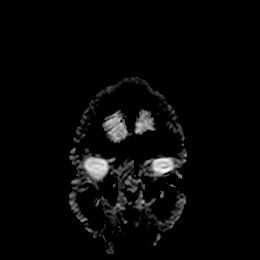

[Series 9: T1 · sagittal · 5.0mm · 0.75mm/px · 2 of 23 slices shown]
[im 1/23]
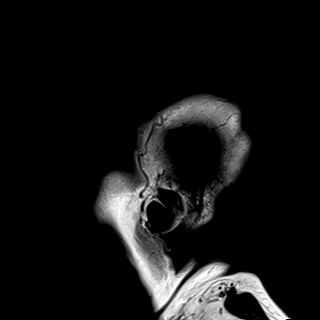
[im 23/23]
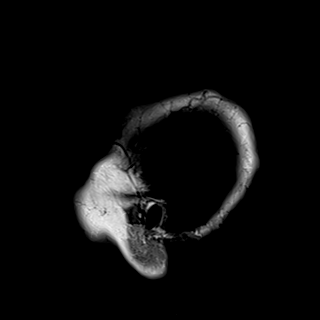

[Series 10: T2 · axial · 5.0mm · 0.72mm/px · z∈[-89,+65]mm · 2 of 27 slices shown (1 of 2)]
[im 1/27]
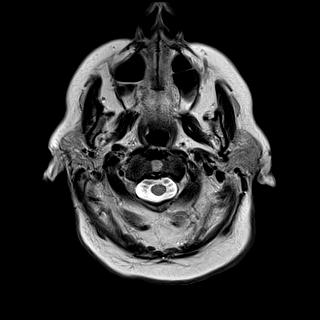
[im 27/27]
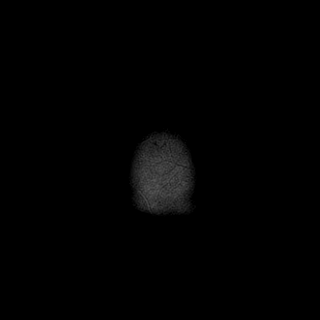

[Series 11: FLAIR · axial · 5.0mm · 0.45mm/px · z∈[-92,+62]mm · 2 of 27 slices shown]
[im 1/27]
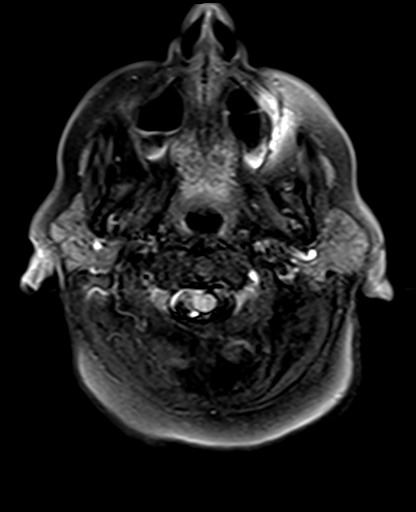
[im 27/27]
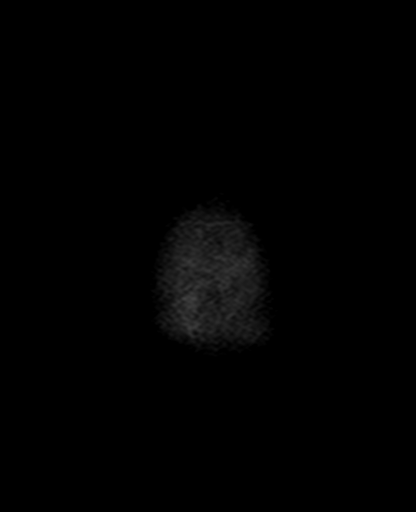

[Series 12: mag_images · axial · 3.0mm · 0.90mm/px · z∈[-100,+75]mm · 4 of 60 slices shown]
[im 1/60]
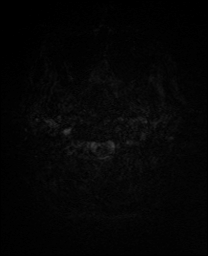
[im 20/60]
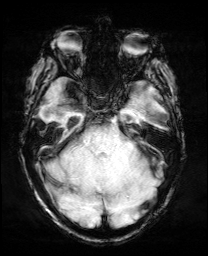
[im 40/60]
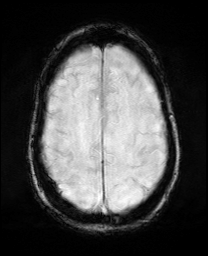
[im 60/60]
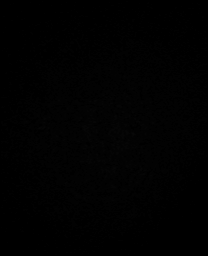

[Series 13: pha_images · axial · 3.0mm · 0.90mm/px · z∈[-97,+69]mm · 4 of 57 slices shown]
[im 1/57]
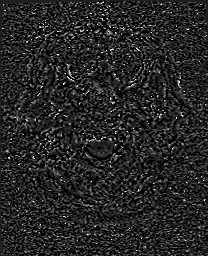
[im 19/57]
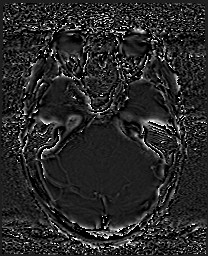
[im 38/57]
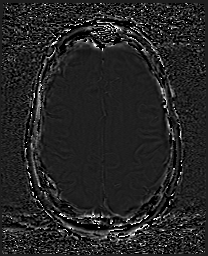
[im 57/57]
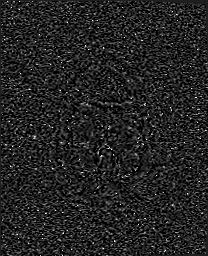

[Series 14: swi_images · axial · 3.0mm · 0.90mm/px · z∈[-100,+75]mm · 4 of 60 slices shown]
[im 1/60]
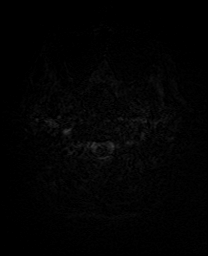
[im 20/60]
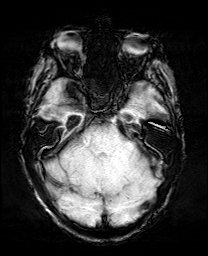
[im 40/60]
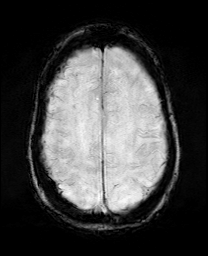
[im 60/60]
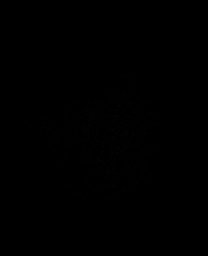

[Series 15: mip_images(sw) · axial · 24.0mm · 0.90mm/px · z∈[-90,+64]mm · 4 of 53 slices shown]
[im 1/53]
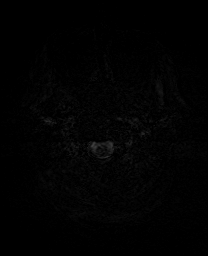
[im 18/53]
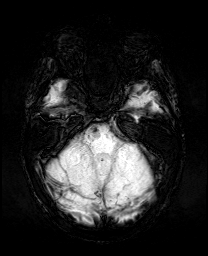
[im 35/53]
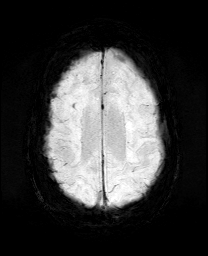
[im 53/53]
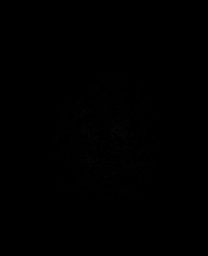

[Series 17: T2 · coronal · 5.0mm · 0.34mm/px · 2 of 29 slices shown (2 of 2)]
[im 1/29]
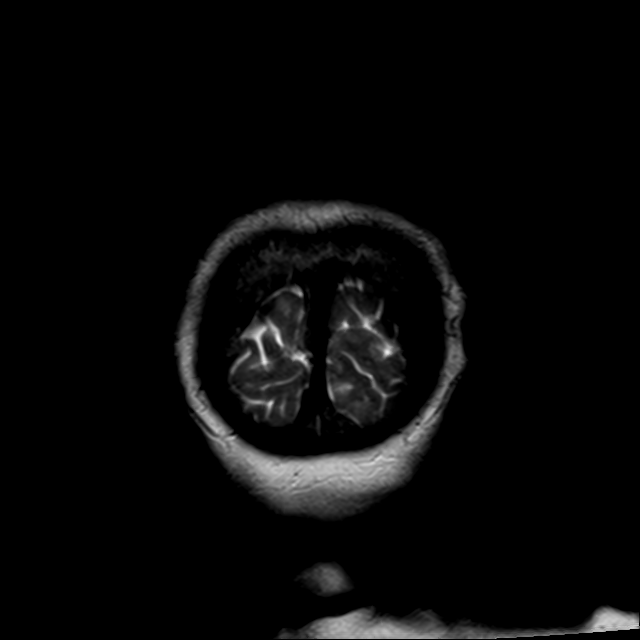
[im 29/29]
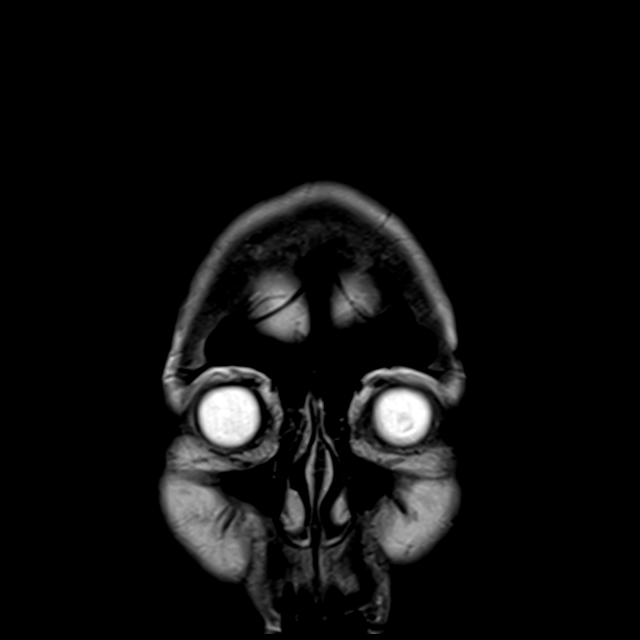

[44 of 48 positions shown; findings below may reference images not displayed]

FINDINGS: Brain: Thin (approximately 2 mm thick) right cerebral convexity
subdural collection, which in retrospect was likely present on CT
from [DATE]. No significant mass effect. No acute infarct.
Moderate generalized atrophy with ex vacuo ventricular dilation. No
hydrocephalus. Mild for age scattered T2/FLAIR hyperintensities
within the white matter, compatible with chronic microvascular
ischemic disease. Suspected small remote right thalamic lacunar
infarct. No midline shift. Basal cisterns are patent.

Vascular: See concurrent MRA for further evaluation.

Skull and upper cervical spine: Normal marrow signal.

Sinuses/Orbits: Small amount of fluid layering in the right sphenoid
sinus. Otherwise, clear sinuses. Unremarkable orbits.

Other: No mastoid effusions.
IMPRESSION: 1. No acute infarct.
2. Thin (approximately 2 mm thick) right cerebral convexity subdural
collection, which was likely present on CT from [DATE]. This
likely represents a small chronic subdural hemorrhage. No
significant mass effect.
3. Small remote right thalamic lacunar infarct and mild chronic
microvascular ischemic disease.

## 2020-11-26 NOTE — Significant Event (Signed)
Rapid Response Event Note   Reason for Call :  Episode of not speaking and staring  Initial Focused Assessment:  Upon my arrival patient is talking and interacting with staff.  She is sitting upright in the chair. NIHSS 6, she stated the month was February, mild dysarthria with dentures, drift in all extremities.   Dr Amada Jupiter at bedside to assess patient.  BP 101/62  HR 65  RR 20  O2 sat 94% on RA.  BP earlier this am 176/58  HR 70  BP rechecked at noon  147/61  HR 66   Interventions:  Code Stroke canceled per Dr Amada Jupiter since symptoms have improved/resolved.  Plan of Care:    Event Summary:   MD Notified: Dr Joseph Art & Dr Amada Jupiter Call Time: 1129 Arrival Time: 1132 End Time: 1205  Marcellina Millin, RN

## 2020-11-26 NOTE — Progress Notes (Signed)
PROGRESS NOTE    Chelsea Moss  EXH:[id number] DOB: January 23, 1927 DOA: 11/19/2020 PCP: Rolm Gala, NP     Brief Narrative:  Chelsea Moss is an 87 y.o. WF PMHx Hypothyroidism, essential HTN, CAD, chronic diastolic CHF, CAD s/p  endarterectomy, CKD stage IIIb,   Presents to the ED with confusion elevated blood pressure and edema in the ED she was found to have a blood pressure systolic of 190 sodium 131, creatinine 1.3, mild leukocytosis with left shift lactic acid 1.1   Subjective: 7/9 afebrile overnight.  Family present in room patient became somewhat unresponsive (staring at son-in-law).  Rapid response called---> neurology.  Per neurology note patient stated She states that she does remember having some difficulty speaking, but she also feels that there was a brief period of time that she is not aware of and amnestic to.  She does not have any definite history of these episodes that family is aware of, but she had a reported TIA several years ago which her daughter states sounds like it was very similar.    Assessment & Plan: Covid vaccination; vaccinated 1/3.  States does not want remainder of vaccinations because she is 87 years old.   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 -2D echo was done that showed a preserved EF grade 1 diastolic heart failure no wall motion abnormalities. -7/8 orthostatic vitals every shift -Physical therapy evaluated the patient recommended  home health PT.   Acute metabolic encephalopathy: Has had recurrent hypothermia Cytogeneticist.PRN   Leukocytosis: Resolved likely stress margination.   Iron deficiency anemia: Hemoglobin dropped 11. To 8.9, no signs of overt bleeding recheck a CBC today.  Hypothyroidism: Continue Synthroid.  Acute on CKD Stage IIIb: (Baseline Cr 1.3) Lab Results  Component Value Date   CREATININE 1.07 (H) 11/26/2020   CREATININE 1.06 (H) 11/25/2020   CREATININE 1.14 (H) 11/24/2020   CREATININE 1.38 (H) 11/23/2020   CREATININE 1.51 (H) 11/22/2020  -Most likely secondary to overdiuresis  Chronic back pain - Normally on Diclofenac 75 mg BID, not ordered I am assuming secondary to her acute on CKD stage IIIb.  Would restart once patient's renal function returns to normal. - 7/8 increase Tramadol 50 mg QID PRN   History of CVA: -Continue statin and Plavix. -7/9 given patient's SSX this a.m. neurology recommends following MRI brain, MRA head discontinue tramadol EEG carotid Doppler continue Plavix antiepileptics only if EEG is abnormal.        DVT prophylaxis: Lovenox Code Status: DNR Family Communication: 7/8 spoke with daughter Kipp Laurence and husband at bedside extensively, discussed plan of care answered all questions Status is: Inpatient    Dispo: The patient is from:               Anticipated d/c is to:               Anticipated d/c date is:               Patient currently  Consultants:    Procedures/Significant Events:    I have personally reviewed and interpreted all radiology studies and my findings are as above.  VENTILATOR SETTINGS:    Cultures   Antimicrobials:    Devices    LINES / TUBES:      Continuous Infusions:   Objective: Vitals:   11/26/20 1135 11/26/20 1144 11/26/20 1213 11/26/20 1927  BP: 101/62 (!) 106/42 (!) 147/61 (!) 146/53  Pulse: 60 63 72 65  Resp: (!) Temp:    98.4 F (36.9 C)  TempSrc:    Oral   SpO2: 94% 95%  94%  Weight:      Height:        Intake/Output Summary (Last 24 hours) at 11/26/2020 2204 Last data filed at 11/26/2020 0830 Gross per 24 hour  Intake 358 ml  Output 200 ml  Net 158 ml    Filed Weights   11/24/20 0400 11/25/20 0352 11/26/20 0459  Weight: 68.8 kg 69.9 kg 69.2 kg    Examination:  General: A/O x4, No acute respiratory distress Eyes: negative scleral hemorrhage, negative anisocoria, negative icterus ENT: Negative Runny nose, negative gingival bleeding, Neck:  Negative scars, masses, torticollis, lymphadenopathy, JVD Lungs: Clear to auscultation bilaterally without wheezes or crackles Cardiovascular: Regular rate and rhythm without murmur gallop or rub normal S1 and S2 Abdomen: negative abdominal pain, nondistended, positive soft, bowel sounds, no rebound, no ascites, no appreciable mass Extremities: No significant cyanosis, clubbing, or edema bilateral lower extremities Musculoskeletal; pain to palpation lower back. Skin: Negative rashes, lesions, ulcers Psychiatric:  Negative depression, negative anxiety, negative fatigue, negative mania  Central nervous system:  Cranial nerves II through XII intact, tongue/uvula midline, all extremities muscle strength 5/5, sensation intact throughout, negative dysarthria, negative expressive aphasia, negative receptive aphasia.  .     Data Reviewed: Care during the described time interval was provided by me .  I have reviewed this patient's available data, including medical history, events of note, physical examination, and all test results as part of my evaluation.  CBC: Recent Labs  Lab 11/21/20 0940 11/23/20 1436 11/24/20 0353 11/25/20 0525 11/26/20 0546  WBC 7.7 10.4 9.9 11.8* 10.6*  NEUTROABS 5.7 8.9* 7.4 10.0* 7.9*  HGB 9.8* 10.5* 9.7* 10.3* 9.6*  HCT 29.0* 30.8* 28.6* 30.4* 28.6*  MCV 98.3 97.8 98.3 96.8 98.3  PLT 285 354 311 306 296    Basic Metabolic Panel: Recent Labs  Lab 11/22/20 0233  11/23/20 1436 11/24/20 0353 11/25/20 0525 11/26/20 0546  NA 134* 133* 134* 133* 132*  K 4.2 3.7 3.5 4.1 3.9  CL 95* 95* 97* 98 97*  CO2 GLUCOSE 106* 187* 98 113* 100*  BUN 24* CREATININE 1.51* 1.38* 1.14* 1.06* 1.07*  CALCIUM 8.9 8.8* 8.8* 8.8* 8.8*  MG  --  1.9 2.1 2.0 2.1  PHOS  --  2.8 3.0 2.6 3.4    GFR: Estimated Creatinine Clearance: 28.4 mL/min (A) (by C-G formula based on SCr of 1.07 mg/dL (H)). Liver Function Tests: Recent Labs  Lab 11/20/20 0340 11/23/20 1436 11/24/20 0353 11/25/20 0525 11/26/20 0546  AST ALT ALKPHOS 38 46 41 46 52  BILITOT 0.8 0.7 0.8 0.7 0.8  PROT 5.2* 6.0* 5.7* 5.9* 6.0*  ALBUMIN 2.9* 3.1* 2.9* 2.9* 2.9*    No results for input(s): LIPASE, AMYLASE in the last 168 hours. No  results for input(s): AMMONIA in the last 168 hours. Coagulation Profile: No results for input(s): INR, PROTIME in the last 168 hours. Cardiac Enzymes: No results for input(s): CKTOTAL, CKMB, CKMBINDEX, TROPONINI in the last 168 hours. BNP (last 3 results) No results for input(s): PROBNP in the last 8760 hours. HbA1C: No results for input(s): HGBA1C in the last 72 hours. CBG: No results for input(s): GLUCAP in the last 168 hours.  Lipid Profile: No results for input(s): CHOL, HDL, LDLCALC, TRIG, CHOLHDL, LDLDIRECT in the last 72 hours. Thyroid Function Tests: No results for input(s): TSH, T4TOTAL, FREET4, T3FREE, THYROIDAB in the last 72 hours. Anemia Panel: No results for input(s): VITAMINB12, FOLATE, FERRITIN, TIBC, IRON, RETICCTPCT in the last 72 hours. Sepsis Labs: No results for input(s): PROCALCITON, LATICACIDVEN in the last 168 hours.   Recent Results (from the past 240 hour(s))  Urine culture     Status: None   Collection Time: 11/19/20  2:42 PM   Specimen: Urine, Random  Result Value Ref Range Status   Specimen Description URINE, RANDOM  Final   Special Requests NONE  Final   Culture    Final    NO GROWTH Performed at [facility], [address], [place], [place] [address]    Report Status 11/21/2020 FINAL  Final  Blood culture (routine x 2)     Status: None   Collection Time: 11/19/20  6:25 PM   Specimen: BLOOD  Result Value Ref Range Status   Specimen Description BLOOD SITE NOT SPECIFIED  Final   Special Requests   Final    BOTTLES DRAWN AEROBIC AND ANAEROBIC Blood Culture adequate volume   Culture   Final    NO GROWTH 5 DAYS Performed at [facility], [address] [place], [place] [address]    Report Status 11/24/2020 FINAL  Final  Blood culture (routine x 2)     Status: None   Collection Time: 11/19/20  6:26 PM   Specimen: BLOOD  Result Value Ref Range Status   Specimen Description BLOOD SITE NOT SPECIFIED  Final   Special Requests   Final    BOTTLES DRAWN AEROBIC AND ANAEROBIC Blood Culture adequate volume   Culture   Final    NO GROWTH 5 DAYS Performed at [facility], [address] [place], [place] [address]    Report Status 11/24/2020 FINAL  Final  Resp Panel by RT-PCR (Flu A&B, Covid) Nasopharyngeal Swab     Status: None   Collection Time: 11/19/20  6:26 PM   Specimen: Nasopharyngeal Swab; Nasopharyngeal(NP) swabs in vial transport medium  Result Value Ref Range Status   SARS Coronavirus 2 by RT PCR NEGATIVE NEGATIVE Final    Comment: (NOTE) SARS-CoV-2 target nucleic acids are NOT DETECTED.  The SARS-CoV-2 RNA is generally detectable in upper respiratory specimens during the acute phase of infection. The lowest concentration of SARS-CoV-2 viral copies this assay can detect is 138 copies/mL. A negative result does not preclude SARS-Cov-2 infection and should not be used as the sole basis for treatment or other patient management decisions. A negative result may occur with  improper specimen collection/handling, submission of specimen other than nasopharyngeal swab, presence of viral mutation(s) within the areas targeted by  this assay, and inadequate number of viral copies(<138 copies/mL). A negative result must be combined with clinical observations, patient history, and epidemiological information. The expected result is Negative.  Fact Sheet for Patients:  BloggerCourse.com  Fact Sheet for Healthcare Providers:  SeriousBroker.it  This test is no t yet approved or cleared by the [place] and  has been authorized for detection and/or diagnosis of SARS-CoV-2 by FDA under an Emergency Use Authorization (EUA). This EUA will remain  in effect (meaning this test can be used) for the duration of the COVID-19 declaration under Section 564(b)(1) of the Act, 21 U.S.C.section 360bbb-3(b)(1), unless the authorization is terminated  or revoked sooner.       Influenza A by PCR NEGATIVE NEGATIVE Final   Influenza B by PCR NEGATIVE NEGATIVE Final    Comment: (NOTE) The Xpert Xpress SARS-CoV-2/FLU/RSV plus assay is intended as an aid in the diagnosis of influenza from Nasopharyngeal swab specimens and should not be used as a sole basis for treatment. Nasal washings and aspirates are unacceptable for Xpert Xpress SARS-CoV-2/FLU/RSV testing.  Fact Sheet for Patients: BloggerCourse.com  Fact Sheet for Healthcare Providers: SeriousBroker.it  This test is not yet approved or cleared by the [place] FDA and has been authorized for detection and/or diagnosis of SARS-CoV-2 by FDA under an Emergency Use Authorization (EUA). This EUA will remain in effect (meaning this test can be used) for the duration of the COVID-19 declaration under Section 564(b)(1) of the Act, 21 U.S.C. section 360bbb-3(b)(1), unless the authorization is terminated or revoked.  Performed at [facility], [address], [place], [place] [address]           Radiology Studies: MR ANGIO HEAD WO CONTRAST  Result Date:  11/26/2020 CLINICAL DATA:  Follow-up examination for stroke. EXAM: MRA HEAD WITHOUT CONTRAST TECHNIQUE: Angiographic images of the Circle of Willis were acquired using MRA technique without intravenous contrast. COMPARISON:  Comparison made with corresponding brain MRI from the same day. FINDINGS: Anterior circulation: Examination mildly degraded by motion artifact. Visualized distal cervical segments of the internal carotid arteries are patent with antegrade flow. Petrous segments patent bilaterally. Atheromatous irregularity seen throughout the carotid siphons with associated mild to moderate multifocal narrowing. 5 mm focal outpouching extending posteriorly from the supraclinoid right ICA consistent with an aneurysm (series 5, image 96). In additional 4 mm focal outpouching extending posteriorly from the supraclinoid left ICA also consistent with an aneurysm (series 5, image 99). A1 segments patent bilaterally. Normal anterior communicating artery complex. Anterior cerebral arteries patent to their distal aspects without stenosis. Left M1 segment widely patent. Short-segment mild to moderate proximal right M1 stenosis (series 1021, image 9). MCA bifurcations within normal limits. Distal MCA branches well perfused and symmetric, although demonstrates small vessel atheromatous irregularity. Posterior circulation: Vertebral arteries are largely codominant and patent to the vertebrobasilar junction without stenosis. Left PICA origin patent and normal. Right PICA not definitely seen. Short-segment severe stenosis noted involving the mid basilar artery (series 1027, image 12). Basilar patent distally. Superior cerebral arteries patent bilaterally. Left PCA primarily supplied via the basilar. Right PCA supplied via the basilar as well as a prominent right posterior communicating artery. Atheromatous irregularity within the left P1 and P2 segments without high-grade stenosis. Severe distal left P3 stenosis versus  occlusion (series 10/27, image 16). On the right, there is a focal severe proximal right P2 stenosis (series 1033, image 10). Additional severe stenosis versus occlusion involving the distal right P3 branch (series 1027, image 15). Anatomic variants: None significant. Other: None. IMPRESSION: 1. Negative intracranial MRA for large vessel occlusion. 2. 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 3. Severe proximal right P2 stenosis, with additional bilateral severe distal P3 stenoses versus occlusions as above.  4. Short-segment mild to moderate proximal right M1 stenosis. 5. 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 Electronically Signed   By: Rise MuBenjamin  McClintock M.D.   On: 11/26/2020 19:15   MR BRAIN WO CONTRAST  Result Date: 11/26/2020 CLINICAL DATA:  Neuro deficit, acute stroke suspected. EXAM: MRI HEAD WITHOUT CONTRAST TECHNIQUE: Multiplanar, multiecho pulse sequences of the brain and surrounding structures were obtained without intravenous contrast. COMPARISON:  CT head 11/19/2020. FINDINGS: Brain: Thin (approximately 2 mm thick) right cerebral convexity subdural collection, which in retrospect was likely present on CT from November 19, 2020. No significant mass effect. No acute infarct. Moderate generalized atrophy with ex vacuo ventricular dilation. No hydrocephalus. Mild for age scattered T2/FLAIR hyperintensities within the white matter, compatible with chronic microvascular ischemic disease. Suspected small remote right thalamic lacunar infarct. No midline shift. Basal cisterns are patent. Vascular: See concurrent MRA for further evaluation. Skull and upper cervical spine: Normal marrow signal. Sinuses/Orbits: Small amount of fluid layering in the right sphenoid sinus. Otherwise, clear sinuses. Unremarkable orbits. Other: No mastoid effusions. IMPRESSION: 1. No acute infarct. 2. Thin (approximately 2 mm thick) right cerebral convexity subdural  collection, which was likely present on CT from November 19, 2020. This likely represents a small chronic subdural hemorrhage. No significant mass effect. 3. Small remote right thalamic lacunar infarct and mild chronic microvascular ischemic disease. Electronically Signed   By: Feliberto HartsFrederick S Jones MD   On: 11/26/2020 18:30   VAS US CAROTID  Result Date: 11/26/2020 Carotid Arterial Duplex Study Patient Name:  Gretchen PortelaROSEMARIE Foister  Date of Exam:   11/26/2020 Medical Rec #: [id number]        Accession #:    [id number] Date of Birth: Apr 27, 1927        Patient Gender: F Patient Age:   89Y [facility] Procedure:      VAS US CAROTID Referring Phys: [id number] MCNEILL P KIRKPATRICK --------------------------------------------------------------------------------  Indications:       Carotid artery disease and right endarterectomy 8/28/20015 Risk Factors:      Hypertension, past history of smoking. Comparison Study:  No prior study on file Performing Technologist: Sherren Kernsandace Kanady RVS  Examination Guidelines: A complete evaluation includes B-mode imaging, spectral Doppler, color Doppler, and power Doppler as needed of all accessible portions of each vessel. Bilateral testing is considered an integral part of a complete examination. Limited examinations for reoccurring indications may be performed as noted.  Right Carotid Findings: +----------+--------+--------+--------+------------------+------------------+           PSV cm/sEDV cm/sStenosisPlaque DescriptionComments           +----------+--------+--------+--------+------------------+------------------+ CCA Prox  111     21                                intimal thickening +----------+--------+--------+--------+------------------+------------------+ CCA Distal109     16                                intimal thickening +----------+--------+--------+--------+------------------+------------------+ ICA Prox  99      22      1-39%   heterogenous       Patent CEA         +----------+--------+--------+--------+------------------+------------------+ ICA Mid   172     40                                                   +----------+--------+--------+--------+------------------+------------------+  ICA Distal177     40                                                   +----------+--------+--------+--------+------------------+------------------+ ECA       160     4                                                    +----------+--------+--------+--------+------------------+------------------+ +----------+--------+-------+--------+-------------------+           PSV cm/sEDV cmsDescribeArm Pressure (mmHG) +----------+--------+-------+--------+-------------------+ [id number]                                        +----------+--------+-------+--------+-------------------+ +---------+--------+--+--------+--+ VertebralPSV cm/s85EDV cm/s12 +---------+--------+--+--------+--+ Elevated velocities mid and distal ICA Left Carotid Findings: +----------+--------+--------+--------+------------------+------------------+           PSV cm/sEDV cm/sStenosisPlaque DescriptionComments           +----------+--------+--------+--------+------------------+------------------+ CCA Prox  138     17                                intimal thickening +----------+--------+--------+--------+------------------+------------------+ CCA Distal117     21                                intimal thickening +----------+--------+--------+--------+------------------+------------------+ ICA Prox  126     27      1-39%   heterogenous                         +----------+--------+--------+--------+------------------+------------------+ ICA Mid   99      17                                                   +----------+--------+--------+--------+------------------+------------------+ ICA Distal106     29                                                    +----------+--------+--------+--------+------------------+------------------+ ECA       212     13                                                   +----------+--------+--------+--------+------------------+------------------+ +----------+--------+--------+--------+-------------------+           PSV cm/sEDV cm/sDescribeArm Pressure (mmHG) +----------+--------+--------+--------+-------------------+ [id number]                                         +----------+--------+--------+--------+-------------------+ +---------+--------+--+--------+--+ VertebralPSV cm/s99EDV cm/s18 +---------+--------+--+--------+--+   Summary:  Right Carotid: Velocities in the right ICA are consistent with a 1-39% stenosis. Left Carotid: Velocities in the left ICA are consistent with a 1-39% stenosis. Vertebrals:  Bilateral vertebral arteries demonstrate antegrade flow. Subclavians: Normal flow hemodynamics were seen in bilateral subclavian              arteries. *See table(s) above for measurements and observations.     Preliminary         Scheduled Meds:  amLODipine  10 mg Oral Daily   busPIRone  5 mg Oral BID   carvedilol  3.125 mg Oral BID WC   clopidogrel  75 mg Oral QHS   enoxaparin (LOVENOX) injection  30 mg Subcutaneous Daily   ferrous sulfate  325 mg Oral Q lunch   gabapentin  300 mg Oral QHS   hydrALAZINE  5 mg Oral Q8H   irbesartan  300 mg Oral Daily   isosorbide mononitrate  60 mg Oral Daily   levothyroxine  25 mcg Oral QAC breakfast   pantoprazole  40 mg Oral q morning   silver sulfADIAZINE   Topical Daily   sodium chloride flush  3 mL Intravenous Q12H   Continuous Infusions:   LOS: 6 days    Time spent:40 min    Joslyne Marshburn, Roselind Messier, MD [facility]   If 7PM-7AM, please contact night-coverage 11/26/2020, 10:04 PM

## 2020-11-26 NOTE — Progress Notes (Signed)
Physical Therapy Treatment Patient Details Name: Chelsea Moss MRN: [id number] DOB: [birth year] Today's Date: 11/26/2020    History of Present Illness 87 y.o. female presents to [facility] ED on 11/19/2020 with confusion, hypertension, and edema. Pt admitted for hypertensive emergency and acute metabolic encephalopathy. PMH includes hypothyroidism, essential hypertension, chronic kidney disease stage IIIb carotid artery disease status post endarterectomy chronic diastolic heart failure.    PT Comments    Patient up in chair and clearly in pain from her low back. Patient agrees getting up and changing position will help and assisted to walk up to 25 ft with min assist and RW. Assisted back to bed for pain relief with pt very grateful. Further exercises deferred due to pain.    Follow Up Recommendations  Home health PT;Supervision/Assistance - 24 hour     Equipment Recommendations  None recommended by PT    Recommendations for Other Services       Precautions / Restrictions Precautions Precautions: Fall    Mobility  Bed Mobility Overal bed mobility: Needs Assistance Bed Mobility: Sit to Supine;Sit to Sidelying         Sit to sidelying: Mod assist General bed mobility comments: cued for sit to side to minimize back pain; assist to raise legs onto bed; cues to roll onto her back    Transfers Overall transfer level: Needs assistance Equipment used: Rolling walker (2 wheeled) Transfers: Sit to/from Stand Sit to Stand: Min assist         General transfer comment: vebal cues for hand placement/technique  Ambulation/Gait Ambulation/Gait assistance: Min assist Gait Distance (Feet): 25 Feet (seated for toileting on BSC; 5 ft) Assistive device: Rolling walker (2 wheeled) Gait Pattern/deviations: Step-through pattern;Trunk flexed Gait velocity: reduced   General Gait Details: pt with slowed step-through gait, reduced stride length. Gait tolerance limited by back pain   Stairs              Wheelchair Mobility    Modified Rankin (Stroke Patients Only)       Balance Overall balance assessment: Needs assistance Sitting-balance support: No upper extremity supported;Feet supported Sitting balance-Leahy Scale: Good     Standing balance support: Bilateral upper extremity supported Standing balance-Leahy Scale: Poor Standing balance comment: reliant on UE support of RW                            Cognition Arousal/Alertness: Awake/alert Behavior During Therapy: Anxious Overall Cognitive Status: Impaired/Different from baseline Area of Impairment: Awareness;Attention;Following commands;Safety/judgement;Problem solving                   Current Attention Level: Sustained   Following Commands: Follows one step commands with increased time Safety/Judgement: Decreased awareness of safety Awareness: Emergent Problem Solving: Slow processing;Difficulty sequencing;Requires verbal cues;Requires tactile cues General Comments: requires repeated cues for following commands; internally distracted by pain; thinks she hears her children laughing at her (nursing outside her room)      Exercises      General Comments General comments (skin integrity, edema, etc.): pt reports sitting in reclner x 3 hrs (son concurrs) and back very painful;      Pertinent Vitals/Pain Pain Assessment: Faces Faces Pain Scale: Hurts whole lot Pain Location: low back Pain Descriptors / Indicators: Sharp Pain Intervention(s): Limited activity within patient's tolerance;Monitored during session;Repositioned    Home Living  Prior Function            PT Goals (current goals can now be found in the care plan section) Acute Rehab PT Goals Patient Stated Goal: to improve mobility and go home Time For Goal Achievement: 12/05/20 Potential to Achieve Goals: Good Progress towards PT goals: Not progressing toward goals - comment (limited  by back pain)    Frequency    Min 3X/week      PT Plan Current plan remains appropriate    Co-evaluation              AM-PAC PT "6 Clicks" Mobility   Outcome Measure  Help needed turning from your back to your side while in a flat bed without using bedrails?: None Help needed moving from lying on your back to sitting on the side of a flat bed without using bedrails?: A Little Help needed moving to and from a bed to a chair (including a wheelchair)?: A Little Help needed standing up from a chair using your arms (e.g., wheelchair or bedside chair)?: A Little Help needed to walk in hospital room?: A Little Help needed climbing 3-5 steps with a railing? : A Lot 6 Click Score: 18    End of Session Equipment Utilized During Treatment: Gait belt Activity Tolerance: Patient limited by fatigue;Patient limited by pain (nausea) Patient left: with call bell/phone within reach;in bed;with bed alarm set Nurse Communication: Mobility status PT Visit Diagnosis: Unsteadiness on feet (R26.81);Other abnormalities of gait and mobility (R26.89);Muscle weakness (generalized) (M62.81)     Time: 5732-2025 PT Time Calculation (min) (ACUTE ONLY): 30 min  Charges:  $Gait Training: 8-22 mins $Therapeutic Exercise: 8-22 mins                      Jerolyn Center, PT Pager [phone]    Zena Amos 11/26/2020, 3:13 PM

## 2020-11-26 NOTE — Consult Note (Signed)
Neurology Consultation Reason for Consult: Code stroke Referring Physician: Joseph Art, C  CC: Code stroke  History is obtained from: Patient, family  HPI: Chelsea Moss is a 87 y.o. female admitted with hypertensive urgency who was doing well this morning and in her normal mental state.  Suddenly at 11 AM, the patient had a mental status change.  She had been interactive and appropriate, but then the nurse describes that she was staring pastor, not moving her eyes and not responding.  She gradually improved, and when the nurse asked her to follow commands, she would do so but it took her a good 10 minutes before she came back relatively normal.  She states that she does remember having some difficulty speaking, but she also feels that there was a brief period of time that she is not aware of and amnestic to.  She does not have any definite history of these episodes that family is aware of, but she had a reported TIA several years ago which her daughter states sounds like it was very similar.  Of note, her blood pressure at 4 AM was 176 and her blood pressure during the event was 106/42.  LKW: 11 AM tpa given?: no, resolution of symptoms   ROS: A 14 point ROS was performed and is negative except as noted in the HPI.   Past Medical History:  Diagnosis Date   Anemia    Carotid artery stenosis    CHF (congestive heart failure) (HCC)    CKD (chronic kidney disease)    Diastolic heart failure with preserved ejection fraction (HCC)    Gait disturbance    High blood pressure    High cholesterol    History of CEA (carotid endarterectomy)    Hypothyroidism    Iron deficiency    Lacunar infarction (HCC)    Low back pain without sciatica    Muscular degeneration    Osteopenia    Skin lesion    Statin intolerance    Stroke (cerebrum) (HCC)    Tibialis posterior tendinitis      Family History  Problem Relation Age of Onset   Heart disease Mother    Emphysema Father      Social  History:  reports that she quit smoking about 26 years ago. Her smoking use included cigarettes. She has never used smokeless tobacco. She reports current alcohol use. She reports that she does not use drugs.   Exam: Current vital signs: BP (!) 147/61   Pulse 72   Temp 97.7 F (36.5 C) (Oral)   Resp 20   Ht 4\' 11"  (1.499 m)   Wt 69.2 kg   SpO2 95%   BMI 30.81 kg/m  Vital signs in last 24 hours: Temp:  [97.7 F (36.5 C)-97.9 F (36.6 C)] 97.7 F (36.5 C) (07/09 0459) Pulse Rate:  [60-74] 72 (07/09 1213) Resp:  [17-24] 20 (07/09 1144) BP: (101-176)/(42-62) 147/61 (07/09 1213) SpO2:  [94 %-97 %] 95 % (07/09 1144) Weight:  [69.2 kg] 69.2 kg (07/09 0459)   Physical Exam  Constitutional: Appears well-developed and well-nourished.  Psych: Affect appropriate to situation Eyes: No scleral injection HENT: No OP obstruction MSK: no joint deformities.  Cardiovascular: Normal rate and regular rhythm.  Respiratory: Effort normal, non-labored breathing GI: Soft.  No distension. There is no tenderness.  Skin: WDI  Neuro: Mental Status: Patient is awake, alert, oriented to person, place, month, year, and situation. Patient is able to give a clear and coherent history. No signs of  aphasia or neglect Cranial Nerves: II: Visual Fields are full. Pupils are equal, round, and reactive to light.   III,IV, VI: EOMI without ptosis or diploplia.  V: Facial sensation is symmetric to temperature VII: Facial movement is symmetric.  VIII: hearing is intact to voice X: Uvula elevates symmetrically XI: Shoulder shrug is symmetric. XII: tongue is midline without atrophy or fasciculations.  Motor: She gives poor effort because of pain, but there is no clear focal weakness. Sensory: Sensation is symmetric to light touch and temperature in the arms and legs. Deep Tendon Reflexes: 2+ and symmetric in the biceps and patellae.  Cerebellar: No clear ataxia on finger-nose-finger     I have  reviewed labs in epic and the results pertinent to this consultation are: Sodium 132 Creatinine 1.07 Calcium 8.8   I have reviewed the images obtained: CT head from 7/2-negative  Impression: 87 year old female admitted with hypertension with a transient episode of behavioral arrest.  The description of the episode seems more consistent with behavioral arrest than actual aphasia, therefore I think that complex partial seizure is a consideration.  TIA could also be a possibility though I feel is less likely.  Finally, given her blood pressure change from this morning, relative hypoperfusion would also be a consideration.  Given the seizure is in the differential, I would actually favor discontinuing tramadol as this can lower seizure threshold considerably.  I do not think I would start antiepileptic medications based on this episode, however, unless the EEG was to be abnormal.  Recommendations: 1) MRI brain, MRA head 2) discontinue tramadol 3) EEG 4) carotid Doppler 5) continue Plavix 6) antiepileptics only if EEG is abnormal.  Ritta Slot, MD [facility] [phone]  If 7pm- 7am, please page neurology on call as listed in AMION.

## 2020-11-27 ENCOUNTER — Inpatient Hospital Stay (HOSPITAL_COMMUNITY): Payer: Medicare Other

## 2020-11-27 DIAGNOSIS — Z8673 Personal history of transient ischemic attack (TIA), and cerebral infarction without residual deficits: Secondary | ICD-10-CM

## 2020-11-27 LAB — COMPREHENSIVE METABOLIC PANEL
ALT: 16 U/L (ref 0–44)
AST: 18 U/L (ref 15–41)
Albumin: 2.7 g/dL — ABNORMAL LOW (ref 3.5–5.0)
Alkaline Phosphatase: 56 U/L (ref 38–126)
Anion gap: 7 (ref 5–15)
BUN: 24 mg/dL — ABNORMAL HIGH (ref 8–23)
CO2: 28 mmol/L (ref 22–32)
Calcium: 9.2 mg/dL (ref 8.9–10.3)
Chloride: 97 mmol/L — ABNORMAL LOW (ref 98–111)
Creatinine, Ser: 1.19 mg/dL — ABNORMAL HIGH (ref 0.44–1.00)
GFR, Estimated: 43 mL/min — ABNORMAL LOW (ref >60)
Glucose, Bld: 100 mg/dL — ABNORMAL HIGH (ref 70–99)
Potassium: 4.1 mmol/L (ref 3.5–5.1)
Sodium: 132 mmol/L — ABNORMAL LOW (ref 135–145)
Total Bilirubin: 0.8 mg/dL (ref 0.3–1.2)
Total Protein: 5.6 g/dL — ABNORMAL LOW (ref 6.5–8.1)

## 2020-11-27 LAB — MAGNESIUM: Magnesium: 2 mg/dL (ref 1.7–2.4)

## 2020-11-27 LAB — CBC WITH DIFFERENTIAL/PLATELET
Abs Immature Granulocytes: 0.04 10*3/uL (ref 0.00–0.07)
Basophils Absolute: 0 10*3/uL (ref 0.0–0.1)
Basophils Relative: 0 %
Eosinophils Absolute: 0.1 10*3/uL (ref 0.0–0.5)
Eosinophils Relative: 1 %
HCT: 26.8 % — ABNORMAL LOW (ref 36.0–46.0)
Hemoglobin: 9 g/dL — ABNORMAL LOW (ref 12.0–15.0)
Immature Granulocytes: 0 %
Lymphocytes Relative: 16 %
Lymphs Abs: 1.4 10*3/uL (ref 0.7–4.0)
MCH: 33 pg (ref 26.0–34.0)
MCHC: 33.6 g/dL (ref 30.0–36.0)
MCV: 98.2 fL (ref 80.0–100.0)
Monocytes Absolute: 1 10*3/uL (ref 0.1–1.0)
Monocytes Relative: 11 %
Neutro Abs: 6.5 10*3/uL (ref 1.7–7.7)
Neutrophils Relative %: 72 %
Platelets: 339 10*3/uL (ref 150–400)
RBC: 2.73 MIL/uL — ABNORMAL LOW (ref 3.87–5.11)
RDW: 12.7 % (ref 11.5–15.5)
WBC: 9.1 10*3/uL (ref 4.0–10.5)
nRBC: 0 % (ref 0.0–0.2)

## 2020-11-27 LAB — PHOSPHORUS: Phosphorus: 2.9 mg/dL (ref 2.5–4.6)

## 2020-11-27 LAB — LIPID PANEL
Cholesterol: 244 mg/dL — ABNORMAL HIGH (ref 0–200)
HDL: 101 mg/dL (ref >40)
LDL Cholesterol: 130 mg/dL — ABNORMAL HIGH (ref 0–99)
Total CHOL/HDL Ratio: 2.4 RATIO
Triglycerides: 66 mg/dL (ref <150)
VLDL: 13 mg/dL (ref 0–40)

## 2020-11-27 LAB — VITAMIN B12: Vitamin B-12: 188 pg/mL (ref 180–914)

## 2020-11-27 LAB — HEMOGLOBIN A1C
Hgb A1c MFr Bld: 5.7 % — ABNORMAL HIGH (ref 4.8–5.6)
Mean Plasma Glucose: 116.89 mg/dL

## 2020-11-27 LAB — AMMONIA: Ammonia: 11 umol/L (ref 9–35)

## 2020-11-27 LAB — HIV ANTIBODY (ROUTINE TESTING W REFLEX): HIV Screen 4th Generation wRfx: NONREACTIVE

## 2020-11-27 MED ORDER — ASPIRIN EC 81 MG PO TBEC
81.0000 mg | DELAYED_RELEASE_TABLET | Freq: Every day | ORAL | Status: DC
  Administered 2020-11-27 – 2020-11-29 (×3): 81 mg via ORAL
  Filled 2020-11-27 (×3): qty 1

## 2020-11-27 MED ORDER — CYANOCOBALAMIN 1000 MCG/ML IJ SOLN
1000.0000 ug | Freq: Once | INTRAMUSCULAR | Status: AC
  Administered 2020-11-27: 1000 ug via INTRAMUSCULAR
  Filled 2020-11-27: qty 1

## 2020-11-27 MED ORDER — TRAMADOL HCL 50 MG PO TABS
50.0000 mg | ORAL_TABLET | Freq: Two times a day (BID) | ORAL | Status: DC
  Administered 2020-11-27 – 2020-11-29 (×5): 50 mg via ORAL
  Filled 2020-11-27 (×5): qty 1

## 2020-11-27 NOTE — Progress Notes (Signed)
Neurology Progress Note  Patient ID: Chelsea Moss is a 87 y.o. female admitted with hypertensive urgency, history of right carotid endarterectomy, who was doing well 7/9 morning and in her normal mental state.  Suddenly at 11 AM, the patient had a mental status change.  She had been interactive and appropriate, but then the nurse describes that she was staring pastor, not moving her eyes and not responding.  She gradually improved, and when the nurse asked her to follow commands, she would do so but it took her a good 10 minutes before she came back relatively normal.  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125 mg, Oral, BID WC, Hall, Carole N, DO, 3.125 mg at 11/27/20 0500   clopidogrel (PLAVIX) tablet 75 mg, 75 mg, Oral, QHS, Synetta Fail, MD, 75 mg at 11/26/20 2116   enoxaparin (LOVENOX) injection 30 mg, 30 mg, Subcutaneous, Daily, Marinda Elk, MD, 30 mg at 11/26/20 3664   ferrous sulfate tablet 325 mg, 325 mg, Oral, Q  lunch, Synetta Fail, MD, 325 mg at 11/26/20 1117   gabapentin (NEURONTIN) capsule 300 mg, 300 mg, Oral, QHS, Synetta Fail, MD, 300 mg at 11/26/20 2116   hydrALAZINE (APRESOLINE) injection 5 mg, 5 mg, Intravenous, Q4H PRN, Marinda Elk, MD, 5 mg at 11/27/20 0450   hydrALAZINE (APRESOLINE) tablet 100 mg, 100 mg, Oral, TID PRN, Marinda Elk, MD, 100 mg at 11/23/20 0343   hydrALAZINE (APRESOLINE) tablet 5 mg, 5 mg, Oral, Q8H, Drema Dallas, MD, 5 mg at 11/27/20 0451   irbesartan (AVAPRO) tablet 300 mg, 300 mg, Oral, Daily, Marinda Elk, MD, 300 mg at 11/26/20 0905   isosorbide mononitrate (IMDUR) 24 hr tablet 60 mg, 60 mg, Oral, Daily, Drema Dallas, MD, 60 mg at 11/26/20 4034   levothyroxine (SYNTHROID) tablet 25 mcg, 25 mcg, Oral, QAC breakfast, Synetta Fail, MD, 25 mcg at 11/26/20 0905   pantoprazole (PROTONIX) EC tablet 40 mg, 40 mg, Oral, q morning, Synetta Fail, MD, 40 mg at 11/26/20 0905   polyethylene glycol (MIRALAX / GLYCOLAX) packet 17 g, 17 g, Oral, Daily PRN, Synetta Fail, MD, 17 g at 11/24/20 1142   silver sulfADIAZINE (SILVADENE) 1 % cream, , Topical, Daily, Drema Dallas, MD, Given at 11/26/20 0906   sodium chloride flush (NS) 0.9 % injection 3 mL, 3 mL, Intravenous, Q12H, Synetta Fail, MD, 3 mL at 11/26/20 2119   Exam: Current vital signs: BP (!) 178/48  Pulse 62   Temp 98.1 F (36.7 C) (Oral)   Resp 14   Ht 4\' 11"  (1.499 m)   Wt 69.5 kg   SpO2 96%   BMI 30.95 kg/m  Vital signs in last 24 hours: Temp:  [98.1 F (36.7 C)-98.4 F (36.9 C)] 98.1 F (36.7 C) (07/10 0431) Pulse Rate:  [60-72] 62 (07/10 0431) Resp:  [13-24] 14 (07/10 0445) BP: (101-205)/(42-62) 178/48 (07/10 0445) SpO2:  [94 %-96 %] 96 % (07/10 0431) Weight:  [69.5 kg] 69.5 kg (07/10 0431)   Gen: In bed, comfortable  Resp: non-labored breathing, no grossly audible wheezing Cardiac: Perfusing extremities well  Abd: soft,  nt  Neuro:  Mental Status: Patient is awake, alert, oriented to person, place, month, year, and situation. Patient is able to give significant amount of history but hesitates on urine appears to get slightly confused at times Cranial Nerves: II: Visual Fields are full. Pupils are equal, round, and reactive to light.   III,IV, VI: EOMI without ptosis or diploplia. V: Facial sensation is symmetric to light touch VII: Facial movement is symmetric. VIII: hearing is intact to voice X: Uvula elevates symmetrically XII: tongue is midline without atrophy or fasciculations. Motor: Mild pronation of bilateral upper extremities without drift.  Bilateral lower extremities are briefly antigravity but pain limited, left side worse than right Sensory: Sensation is symmetric to light touch and temperature in the arms and legs. Cerebellar: No clear ataxia on finger-nose-finger  Pertinent Labs:  Basic Metabolic Panel: Recent Labs  Lab 11/23/20 1436 11/24/20 0353 11/25/20 0525 11/26/20 0546 11/27/20 0302  NA 133* 134* 133* 132* 132*  K 3.7 3.5 4.1 3.9 4.1  CL 95* 97* 98 97* 97*  CO2 28 30 27 27 28   GLUCOSE 187* 98 113* 100* 100*  BUN 21 19 19 22  24*  CREATININE 1.38* 1.14* 1.06* 1.07* 1.19*  CALCIUM 8.8* 8.8* 8.8* 8.8* 9.2  MG 1.9 2.1 2.0 2.1 2.0  PHOS 2.8 3.0 2.6 3.4 2.9   GFR: 43  Estimated Creatinine Clearance: 25.6 mL/min (A) (by C-G formula based on SCr of 1.19 mg/dL (H)).   CBC: Recent Labs  Lab 11/23/20 1436 11/24/20 0353 11/25/20 0525 11/26/20 0546 11/27/20 0302  WBC 10.4 9.9 11.8* 10.6* 9.1  NEUTROABS 8.9* 7.4 10.0* 7.9* 6.5  HGB 10.5* 9.7* 10.3* 9.6* 9.0*  HCT 30.8* 28.6* 30.4* 28.6* 26.8*  MCV 97.8 98.3 96.8 98.3 98.2  PLT 354 311 306 296 339   No results found for: CHOL, HDL, LDLCALC, LDLDIRECT, TRIG, CHOLHDL  No results found for: HGBA1C   No results found for: VITAMINB12   Lab Results  Component Value Date   TSH 3.590 11/19/2020     MRI brain  personally reviewed:  1. No acute infarct. 2. Thin (approximately 2 mm thick) right cerebral convexity subdural collection, which was likely present on CT from November 19, 2020. This likely represents a small chronic subdural hemorrhage. No significant mass effect. 3. Small remote right thalamic lacunar infarct and mild chronic microvascular ischemic disease.  MRA brain personally reviewed:  IMPRESSION: 1. Negative intracranial MRA for large vessel occlusion. 2. 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 3. Severe proximal right P2 stenosis, with additional bilateral severe distal P3 stenoses versus occlusions as above. 4. Short-segment mild to moderate proximal right M1 stenosis. 5. 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  Cartoid duplex:  Right Carotid: Velocities in the right ICA are consistent with a 1-39%  stenosis.  Left Carotid: Velocities in the left ICA are consistent with a 1-39%  stenosis.  Vertebrals:  Bilateral vertebral arteries demonstrate antegrade flow.  Subclavians: Normal flow hemodynamics were seen in bilateral subclavian               arteries.    ECHO 11/20/2020  1. Left ventricular ejection fraction, by estimation, is 65 to 70%. The  left ventricle has normal function. The left ventricle has no regional  wall motion abnormalities. Left ventricular diastolic parameters are  consistent with Grade I diastolic  dysfunction (impaired relaxation).   2. Right ventricular systolic function is normal. The right ventricular  size is normal.   3. The mitral valve is normal in structure. Trivial mitral valve  regurgitation. No evidence of mitral stenosis.   4. The aortic valve is tricuspid. There is mild calcification of the  aortic valve. There is moderate thickening of the aortic valve. Aortic  valve regurgitation is trivial. Mild to moderate aortic valve  sclerosis/calcification is present, without any   evidence of aortic stenosis.  -Normal biatrial sizes  EEG read pending   Impression: This is a pleasant 87 year old woman with a past medical history as detailed above, undergoing neurological work-up for transient alteration in mental status.  She was initially admitted with hypertensive urgency and then noted to have mental status change in the setting of transient hypotension.  There was initial concern for complex partial seizure versus TIA versus global hypoperfusion.  Given the severe stenosis revealed on MRA brain, I favor hypoperfusion, though EEG read is still pending.  Plan as below.  Recommendations:  # Transient episode of altered awareness, favor TIA in the setting of severe basilar stenosis -Dual antiplatelet therapy for 90 days, adding aspirin 81 mg to be stopped in 90 days, continue home Plavix -Lipid panel, A1c, (goal A1c less than 7%, goal LDL less than 70.  May need PSK 9 inhibitor truly unable to take statins secondary to myalgias -Blood pressure goal 140-160 for now -I will follow up EEG read, at this time do not suspect seizure based on other workup to date   # Bilateral ICA aneurysms - Outpatient follow-up with Neurosurgery  # Chronic subdural - Outpatient follow-up with primary care physician  # Back pain  - PT / OT   # Mild confusion -Check B12, HIV, RPR, ammonia for reversible causes, primary team to treat as needed.  Note, goal B12 level greater than 400, plan to start oral supplementation if low normal (standard IM repletion if very low)  Neurology will continue to follow  Brooke Dare MD-PhD [facility] [phone]

## 2020-11-27 NOTE — Progress Notes (Signed)
EEG complete - results pending 

## 2020-11-27 NOTE — Procedures (Signed)
History: 87 year old female being evaluated for transient episode of behavioral arrest  Sedation: None  Technique: This is a 21 channel routine scalp EEG performed at the bedside with bipolar and monopolar montages arranged in accordance to the international 10/20 system of electrode placement. One channel was dedicated to EKG recording.    Background: The background consists of intermixed alpha and beta activities. There is a well defined posterior dominant rhythm of nine hz that attenuates with eye opening. Sleep is recorded with normal appearing structures.   Photic stimulation: Physiologic driving is present  EEG Abnormalities: None  Clinical Interpretation: This normal EEG is recorded in the waking and sleep state. There was no seizure or seizure predisposition recorded on this study. Please note that lack of epileptiform activity on EEG does not preclude the possibility of epilepsy.   Ritta Slot, MD [facility] [phone]  If 7pm- 7am, please page neurology on call as listed in AMION.

## 2020-11-27 NOTE — Progress Notes (Signed)
PROGRESS NOTE    Chelsea Moss  OZH:[id number] DOB: 1926/10/10 DOA: 11/19/2020 PCP: Rolm Gala, NP     Brief Narrative:  Chelsea Moss is an 87 y.o. WF PMHx Hypothyroidism, essential HTN, CAD, chronic diastolic CHF, CAD s/p  endarterectomy, CKD stage IIIb,   Presents to the ED with confusion elevated blood pressure and edema in the ED she was found to have a blood pressure systolic of 190 sodium 131, creatinine 1.3, mild leukocytosis with left shift lactic acid 1.1   Subjective: 7/10 afebrile overnight patient sleeping restfully.  Spoke with family   Assessment & Plan: Covid vaccination; vaccinated 1/3.  States does not want remainder of vaccinations because she is 87 years old.   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 -2D echo was done that showed a preserved EF grade 1 diastolic heart failure no wall motion abnormalities. -7/8 orthostatic vitals every shift -Physical therapy evaluated the patient recommended home health PT.   Acute metabolic encephalopathy: Has had recurrent hypothermia Cytogeneticist.PRN   Leukocytosis: Resolved likely stress margination.   Iron deficiency anemia: Hemoglobin dropped 11. To 8.9, no signs of overt bleeding recheck a CBC today.  Hypothyroidism: Continue Synthroid.  Acute on CKD Stage IIIb: (Baseline Cr 1.3) Lab Results  Component Value Date   CREATININE 1.19 (H) 11/27/2020   CREATININE 1.07 (H) 11/26/2020   CREATININE  1.06 (H) 11/25/2020   CREATININE 1.14 (H) 11/24/2020   CREATININE 1.38 (H) 11/23/2020  - Resolved  Chronic back pain - Normally on Diclofenac 75 mg BID, not ordered I am assuming secondary to her acute on CKD stage IIIb.  Would restart once patient's renal function returns to normal. - 7/10 Tramadol 50 mg BID PRN: After long discussion with son and daughter concerning risk factors, as well as risk factors of other medications were willing to accept that this could lower seizure threshold.     History of CVA: -Continue statin and Plavix. -7/10 EEG:no seizure or seizure predisposition recorded on this study. -7/10 given patient's SSX this a.m. neurology recommends following # Transient episode of altered awareness, favor TIA in the setting of severe basilar stenosis -Dual antiplatelet therapy for 90 days, adding aspirin 81 mg to be stopped in 90 days, continue home Plavix -Lipid panel, A1c, (goal A1c less than 7%, goal LDL less than 70.  May need PSK 9 inhibitor truly unable to take statins secondary to myalgias -Blood pressure goal 140-160 for now -I will follow up EEG read, at this time do not suspect seizure based on other workup to date    # Bilateral ICA aneurysms - Outpatient follow-up with Neurosurgery   # Chronic subdural - Outpatient follow-up with primary care physician   # Back pain - PT / OT    # Mild confusion -Check B12, HIV, RPR, ammonia for reversible causes, primary team to treat as needed.  Note, goal B12 level greater than 400, plan to start oral supplementation if low normal (standard IM repletion if very low)  DVT prophylaxis: Lovenox Code Status: DNR Family Communication: 7/8 spoke with daughter Kipp Laurence and husband at bedside extensively, discussed plan of care answered all questions Status is: Inpatient    Dispo: The patient is from:               Anticipated d/c is to:               Anticipated d/c date is:               Patient currently        Consultants:    Procedures/Significant Events:  7/9 MRI brain W. Wo contrast:No acute infarct. 2. Thin (approximately 2 mm thick) right cerebral convexity subdural collection, which was likely present on CT from November 19, 2020. This likely represents a small chronic subdural hemorrhage. No significant mass effect. 3. Small remote right thalamic lacunar infarct and mild chronic microvascular ischemic disease. 7/9 MRI head W0 contrast: Negative intracranial MRA for large vessel occlusion. 2. 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 3. Severe proximal right P2 stenosis, with additional bilateral severe distal P3 stenoses versus occlusions as above. 4. Short-segment mild to moderate proximal right M1 stenosis. 5. 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 7/10 EEG:no seizure or seizure predisposition recorded on this study.  I have personally reviewed and interpreted all radiology studies and my findings are as above.  VENTILATOR SETTINGS:    Cultures   Antimicrobials:    Devices    LINES / TUBES:      Continuous Infusions:   Objective: Vitals:   11/26/20 1927 11/27/20 0431 11/27/20 0445 11/27/20 1115  BP: (!) 146/53 (!) 205/49 (!) 178/48 (!) 160/44  Pulse: 65 62  67  Resp: 13 13 14 17   Temp: 98.4 F (36.9 C) 98.1 F (36.7 C)  98 F (36.7 C)  TempSrc: Oral Oral  Oral  SpO2: 94% 96%  95%  Weight:  69.5 kg    Height:        Intake/Output Summary (Last 24 hours) at 11/27/2020 1435 Last data filed at 11/27/2020 01/28/2021 Gross per 24 hour  Intake 300 ml  Output 800 ml  Net -500 ml    Filed Weights   11/25/20 0352 11/26/20 0459 11/27/20 0431  Weight: 69.9 kg 69.2 kg 69.5 kg    Examination:  General: Sleeping restfully, No acute respiratory distress Eyes: negative scleral hemorrhage, negative anisocoria, negative icterus ENT: Negative Runny nose, negative gingival bleeding, Neck:   Negative scars, masses, torticollis, lymphadenopathy, JVD Lungs: Clear to auscultation bilaterally without wheezes or crackles Cardiovascular: Regular rate and rhythm without murmur gallop or rub normal S1 and S2 Abdomen: negative abdominal pain, nondistended, positive soft, bowel sounds, no rebound, no ascites, no appreciable mass Extremities: No significant cyanosis, clubbing, or edema bilateral lower extremities Musculoskeletal; pain to palpation lower back. Skin: Negative rashes, lesions, ulcers Psychiatric:  Negative depression, negative anxiety, negative fatigue, negative mania  Central nervous system:  Cranial nerves II through XII intact, tongue/uvula midline, all extremities muscle strength 5/5, sensation intact throughout, negative dysarthria, negative expressive aphasia, negative receptive aphasia.  .     Data Reviewed: Care during the described time interval was provided by me .  I have reviewed this patient's available data, including medical history, events of note, physical examination, and all test results as part of my evaluation.  CBC: Recent Labs  Lab 11/23/20 1436 11/24/20 0353 11/25/20 0525 11/26/20 0546 11/27/20 0302  WBC 10.4 9.9  11.8* 10.6* 9.1  NEUTROABS 8.9* 7.4 10.0* 7.9* 6.5  HGB 10.5* 9.7* 10.3* 9.6* 9.0*  HCT 30.8* 28.6* 30.4* 28.6* 26.8*  MCV 97.8 98.3 96.8 98.3 98.2  PLT 354 311 306 296 339    Basic Metabolic Panel: Recent Labs  Lab 11/23/20 1436 11/24/20 0353 11/25/20 0525 11/26/20 0546 11/27/20 0302  NA 133* 134* 133* 132* 132*  K 3.7 3.5 4.1 3.9 4.1  CL 95* 97* 98 97* 97*  CO2 28 30 27 27 28   GLUCOSE 187* 98 113* 100* 100*  BUN 21 19 19 22  24*  CREATININE 1.38* 1.14* 1.06* 1.07* 1.19*  CALCIUM 8.8* 8.8* 8.8* 8.8* 9.2  MG 1.9 2.1 2.0 2.1 2.0  PHOS 2.8 3.0 2.6 3.4 2.9    GFR: Estimated Creatinine Clearance: 25.6 mL/min (A) (by C-G formula based on SCr of 1.19 mg/dL (H)). Liver Function Tests: Recent Labs  Lab 11/23/20 1436  11/24/20 0353 11/25/20 0525 11/26/20 0546 11/27/20 0302  AST 23 23 22 21 18   ALT 15 15 15 17 16   ALKPHOS 46 41 46 52 56  BILITOT 0.7 0.8 0.7 0.8 0.8  PROT 6.0* 5.7* 5.9* 6.0* 5.6*  ALBUMIN 3.1* 2.9* 2.9* 2.9* 2.7*    No results for input(s): LIPASE, AMYLASE in the last 168 hours. No results for input(s): AMMONIA in the last 168 hours. Coagulation Profile: No results for input(s): INR, PROTIME in the last 168 hours. Cardiac Enzymes: No results for input(s): CKTOTAL, CKMB, CKMBINDEX, TROPONINI in the last 168 hours. BNP (last 3 results) No results for input(s): PROBNP in the last 8760 hours. HbA1C: Recent Labs    11/27/20 1146  HGBA1C 5.7*   CBG: No results for input(s): GLUCAP in the last 168 hours.  Lipid Profile: Recent Labs    11/27/20 1146  CHOL 244*  HDL 101  LDLCALC 130*  TRIG 66  CHOLHDL 2.4   Thyroid Function Tests: No results for input(s): TSH, T4TOTAL, FREET4, T3FREE, THYROIDAB in the last 72 hours. Anemia Panel: Recent Labs    11/27/20 1146  VITAMINB12 188   Sepsis Labs: No results for input(s): PROCALCITON, LATICACIDVEN in the last 168 hours.   Recent Results (from the past 240 hour(s))  Urine culture     Status: None   Collection Time: 11/19/20  2:42 PM   Specimen: Urine, Random  Result Value Ref Range Status   Specimen Description URINE, RANDOM  Final   Special Requests NONE  Final   Culture   Final    NO GROWTH Performed at [facility], [address], [place], 01/20/21 [facility]    Report Status 11/21/2020 FINAL  Final  Blood culture (routine x 2)     Status: None   Collection Time: 11/19/20  6:25 PM   Specimen: BLOOD  Result Value Ref Range Status   Specimen Description BLOOD SITE NOT SPECIFIED  Final   Special Requests   Final    BOTTLES DRAWN AEROBIC AND ANAEROBIC Blood Culture adequate volume   Culture   Final    NO GROWTH 5 DAYS Performed at [facility], [address] [place], 01/20/21 [facility]    Report  Status 11/24/2020 FINAL  Final  Blood culture (routine x 2)     Status: None   Collection Time: 11/19/20  6:26 PM   Specimen: BLOOD  Result Value Ref Range Status   Specimen Description BLOOD SITE NOT SPECIFIED  Final   Special Requests   Final    BOTTLES  DRAWN AEROBIC AND ANAEROBIC Blood Culture adequate volume   Culture   Final    NO GROWTH 5 DAYS Performed at [facility], [address] [place], [place] [address]    Report Status 11/24/2020 FINAL  Final  Resp Panel by RT-PCR (Flu A&B, Covid) Nasopharyngeal Swab     Status: None   Collection Time: 11/19/20  6:26 PM   Specimen: Nasopharyngeal Swab; Nasopharyngeal(NP) swabs in vial transport medium  Result Value Ref Range Status   SARS Coronavirus 2 by RT PCR NEGATIVE NEGATIVE Final    Comment: (NOTE) SARS-CoV-2 target nucleic acids are NOT DETECTED.  The SARS-CoV-2 RNA is generally detectable in upper respiratory specimens during the acute phase of infection. The lowest concentration of SARS-CoV-2 viral copies this assay can detect is 138 copies/mL. A negative result does not preclude SARS-Cov-2 infection and should not be used as the sole basis for treatment or other patient management decisions. A negative result may occur with  improper specimen collection/handling, submission of specimen other than nasopharyngeal swab, presence of viral mutation(s) within the areas targeted by this assay, and inadequate number of viral copies(<138 copies/mL). A negative result must be combined with clinical observations, patient history, and epidemiological information. The expected result is Negative.  Fact Sheet for Patients:  BloggerCourse.com  Fact Sheet for Healthcare Providers:  SeriousBroker.it  This test is no t yet approved or cleared by the [place] FDA and  has been authorized for detection and/or diagnosis of SARS-CoV-2 by FDA under an Emergency Use Authorization  (EUA). This EUA will remain  in effect (meaning this test can be used) for the duration of the COVID-19 declaration under Section 564(b)(1) of the Act, 21 U.S.C.section 360bbb-3(b)(1), unless the authorization is terminated  or revoked sooner.       Influenza A by PCR NEGATIVE NEGATIVE Final   Influenza B by PCR NEGATIVE NEGATIVE Final    Comment: (NOTE) The Xpert Xpress SARS-CoV-2/FLU/RSV plus assay is intended as an aid in the diagnosis of influenza from Nasopharyngeal swab specimens and should not be used as a sole basis for treatment. Nasal washings and aspirates are unacceptable for Xpert Xpress SARS-CoV-2/FLU/RSV testing.  Fact Sheet for Patients: BloggerCourse.com  Fact Sheet for Healthcare Providers: SeriousBroker.it  This test is not yet approved or cleared by the [place] FDA and has been authorized for detection and/or diagnosis of SARS-CoV-2 by FDA under an Emergency Use Authorization (EUA). This EUA will remain in effect (meaning this test can be used) for the duration of the COVID-19 declaration under Section 564(b)(1) of the Act, 21 U.S.C. section 360bbb-3(b)(1), unless the authorization is terminated or revoked.  Performed at [facility], [address], [place], [place] [address]           Radiology Studies: MR ANGIO HEAD WO CONTRAST  Result Date: 11/26/2020 CLINICAL DATA:  Follow-up examination for stroke. EXAM: MRA HEAD WITHOUT CONTRAST TECHNIQUE: Angiographic images of the Circle of Willis were acquired using MRA technique without intravenous contrast. COMPARISON:  Comparison made with corresponding brain MRI from the same day. FINDINGS: Anterior circulation: Examination mildly degraded by motion artifact. Visualized distal cervical segments of the internal carotid arteries are patent with antegrade flow. Petrous segments patent bilaterally. Atheromatous irregularity seen throughout the carotid  siphons with associated mild to moderate multifocal narrowing. 5 mm focal outpouching extending posteriorly from the supraclinoid right ICA consistent with an aneurysm (series 5, image 96). In additional 4 mm focal outpouching extending posteriorly from the  supraclinoid left ICA also consistent with an aneurysm (series 5, image 99). A1 segments patent bilaterally. Normal anterior communicating artery complex. Anterior cerebral arteries patent to their distal aspects without stenosis. Left M1 segment widely patent. Short-segment mild to moderate proximal right M1 stenosis (series 1021, image 9). MCA bifurcations within normal limits. Distal MCA branches well perfused and symmetric, although demonstrates small vessel atheromatous irregularity. Posterior circulation: Vertebral arteries are largely codominant and patent to the vertebrobasilar junction without stenosis. Left PICA origin patent and normal. Right PICA not definitely seen. Short-segment severe stenosis noted involving the mid basilar artery (series 1027, image 12). Basilar patent distally. Superior cerebral arteries patent bilaterally. Left PCA primarily supplied via the basilar. Right PCA supplied via the basilar as well as a prominent right posterior communicating artery. Atheromatous irregularity within the left P1 and P2 segments without high-grade stenosis. Severe distal left P3 stenosis versus occlusion (series 10/27, image 16). On the right, there is a focal severe proximal right P2 stenosis (series 1033, image 10). Additional severe stenosis versus occlusion involving the distal right P3 branch (series 1027, image 15). Anatomic variants: None significant. Other: None. IMPRESSION: 1. Negative intracranial MRA for large vessel occlusion. 2. 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 3. Severe proximal right P2 stenosis, with additional bilateral severe distal P3 stenoses versus occlusions as above. 4. Short-segment mild to  moderate proximal right M1 stenosis. 5. 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 Electronically Signed   By: Rise Mu M.D.   On: 11/26/2020 19:15   MR BRAIN WO CONTRAST  Result Date: 11/26/2020 CLINICAL DATA:  Neuro deficit, acute stroke suspected. EXAM: MRI HEAD WITHOUT CONTRAST TECHNIQUE: Multiplanar, multiecho pulse sequences of the brain and surrounding structures were obtained without intravenous contrast. COMPARISON:  CT head 11/19/2020. FINDINGS: Brain: Thin (approximately 2 mm thick) right cerebral convexity subdural collection, which in retrospect was likely present on CT from November 19, 2020. No significant mass effect. No acute infarct. Moderate generalized atrophy with ex vacuo ventricular dilation. No hydrocephalus. Mild for age scattered T2/FLAIR hyperintensities within the white matter, compatible with chronic microvascular ischemic disease. Suspected small remote right thalamic lacunar infarct. No midline shift. Basal cisterns are patent. Vascular: See concurrent MRA for further evaluation. Skull and upper cervical spine: Normal marrow signal. Sinuses/Orbits: Small amount of fluid layering in the right sphenoid sinus. Otherwise, clear sinuses. Unremarkable orbits. Other: No mastoid effusions. IMPRESSION: 1. No acute infarct. 2. Thin (approximately 2 mm thick) right cerebral convexity subdural collection, which was likely present on CT from November 19, 2020. This likely represents a small chronic subdural hemorrhage. No significant mass effect. 3. Small remote right thalamic lacunar infarct and mild chronic microvascular ischemic disease. Electronically Signed   By: Feliberto Harts MD   On: 11/26/2020 18:30   VAS US CAROTID  Result Date: 11/26/2020 Carotid Arterial Duplex Study Patient Name:  JONTAE SONIER  Date of Exam:   11/26/2020 Medical Rec #: [id number]        Accession #:    [id number] Date of Birth: [birth year]        Patient Gender: F  Patient Age:   89Y Exam Location:  [facility] Procedure:      VAS US CAROTID Referring Phys: [id number] MCNEILL P KIRKPATRICK --------------------------------------------------------------------------------  Indications:       Carotid artery disease and right endarterectomy 8/28/20015 Risk Factors:      Hypertension, past history of smoking. Comparison Study:  No prior study  on file Performing Technologist: Sherren Kernsandace Kanady RVS  Examination Guidelines: A complete evaluation includes B-mode imaging, spectral Doppler, color Doppler, and power Doppler as needed of all accessible portions of each vessel. Bilateral testing is considered an integral part of a complete examination. Limited examinations for reoccurring indications may be performed as noted.  Right Carotid Findings: +----------+--------+--------+--------+------------------+------------------+           PSV cm/sEDV cm/sStenosisPlaque DescriptionComments           +----------+--------+--------+--------+------------------+------------------+ CCA Prox  111     21                                intimal thickening +----------+--------+--------+--------+------------------+------------------+ CCA Distal109     16                                intimal thickening +----------+--------+--------+--------+------------------+------------------+ ICA Prox  99      22      1-39%   heterogenous      Patent CEA         +----------+--------+--------+--------+------------------+------------------+ ICA Mid   172     40                                                   +----------+--------+--------+--------+------------------+------------------+ ICA Distal177     40                                                   +----------+--------+--------+--------+------------------+------------------+ ECA       160     4                                                     +----------+--------+--------+--------+------------------+------------------+ +----------+--------+-------+--------+-------------------+           PSV cm/sEDV cmsDescribeArm Pressure (mmHG) +----------+--------+-------+--------+-------------------+ [id number]                                        +----------+--------+-------+--------+-------------------+ +---------+--------+--+--------+--+ VertebralPSV cm/s85EDV cm/s12 +---------+--------+--+--------+--+ Elevated velocities mid and distal ICA Left Carotid Findings: +----------+--------+--------+--------+------------------+------------------+           PSV cm/sEDV cm/sStenosisPlaque DescriptionComments           +----------+--------+--------+--------+------------------+------------------+ CCA Prox  138     17                                intimal thickening +----------+--------+--------+--------+------------------+------------------+ CCA Distal117     21                                intimal thickening +----------+--------+--------+--------+------------------+------------------+ ICA Prox  126     27      1-39%   heterogenous                         +----------+--------+--------+--------+------------------+------------------+  ICA Mid   99      17                                                   +----------+--------+--------+--------+------------------+------------------+ ICA Distal106     29                                                   +----------+--------+--------+--------+------------------+------------------+ ECA       212     13                                                   +----------+--------+--------+--------+------------------+------------------+ +----------+--------+--------+--------+-------------------+           PSV cm/sEDV cm/sDescribeArm Pressure (mmHG) +----------+--------+--------+--------+-------------------+ [id number]                                          +----------+--------+--------+--------+-------------------+ +---------+--------+--+--------+--+ VertebralPSV cm/s99EDV cm/s18 +---------+--------+--+--------+--+   Summary: Right Carotid: Velocities in the right ICA are consistent with a 1-39% stenosis. Left Carotid: Velocities in the left ICA are consistent with a 1-39% stenosis. Vertebrals:  Bilateral vertebral arteries demonstrate antegrade flow. Subclavians: Normal flow hemodynamics were seen in bilateral subclavian              arteries. *See table(s) above for measurements and observations.     Preliminary         Scheduled Meds:  amLODipine  10 mg Oral Daily   aspirin EC  81 mg Oral Daily   busPIRone  5 mg Oral BID   carvedilol  3.125 mg Oral BID WC   clopidogrel  75 mg Oral QHS   enoxaparin (LOVENOX) injection  30 mg Subcutaneous Daily   ferrous sulfate  325 mg Oral Q lunch   gabapentin  300 mg Oral QHS   hydrALAZINE  5 mg Oral Q8H   irbesartan  300 mg Oral Daily   isosorbide mononitrate  60 mg Oral Daily   levothyroxine  25 mcg Oral QAC breakfast   pantoprazole  40 mg Oral q morning   silver sulfADIAZINE   Topical Daily   sodium chloride flush  3 mL Intravenous Q12H   Continuous Infusions:   LOS: 7 days    Time spent:40 min    Maxey Ransom, Roselind Messier, MD [facility]   If 7PM-7AM, please contact night-coverage 11/27/2020, 2:35 PM

## 2020-11-27 NOTE — Progress Notes (Signed)
Urine output for shift is 200cc. Patient c/o difficulty voiding. Bladder scan shows 331cc.  Toniann Fail MD notified and orders to do I/O cath obtained. I/O cath yielded 700cc clear yellow urine. Patient tolerated well.

## 2020-11-28 LAB — CBC WITH DIFFERENTIAL/PLATELET
Abs Immature Granulocytes: 0.04 10*3/uL (ref 0.00–0.07)
Basophils Absolute: 0 10*3/uL (ref 0.0–0.1)
Basophils Relative: 0 %
Eosinophils Absolute: 0.1 10*3/uL (ref 0.0–0.5)
Eosinophils Relative: 1 %
HCT: 26.2 % — ABNORMAL LOW (ref 36.0–46.0)
Hemoglobin: 8.8 g/dL — ABNORMAL LOW (ref 12.0–15.0)
Immature Granulocytes: 0 %
Lymphocytes Relative: 12 %
Lymphs Abs: 1.1 10*3/uL (ref 0.7–4.0)
MCH: 33 pg (ref 26.0–34.0)
MCHC: 33.6 g/dL (ref 30.0–36.0)
MCV: 98.1 fL (ref 80.0–100.0)
Monocytes Absolute: 1.1 10*3/uL — ABNORMAL HIGH (ref 0.1–1.0)
Monocytes Relative: 11 %
Neutro Abs: 7.5 10*3/uL (ref 1.7–7.7)
Neutrophils Relative %: 76 %
Platelets: 342 10*3/uL (ref 150–400)
RBC: 2.67 MIL/uL — ABNORMAL LOW (ref 3.87–5.11)
RDW: 13 % (ref 11.5–15.5)
WBC: 9.8 10*3/uL (ref 4.0–10.5)
nRBC: 0 % (ref 0.0–0.2)

## 2020-11-28 LAB — URINALYSIS, ROUTINE W REFLEX MICROSCOPIC
Bilirubin Urine: NEGATIVE
Glucose, UA: NEGATIVE mg/dL
Ketones, ur: NEGATIVE mg/dL
Nitrite: NEGATIVE
Protein, ur: 100 mg/dL — AB
RBC / HPF: >50 RBC/hpf — ABNORMAL HIGH (ref 0–5)
Specific Gravity, Urine: 1.016 (ref 1.005–1.030)
WBC, UA: >50 WBC/hpf — ABNORMAL HIGH (ref 0–5)
pH: 5 (ref 5.0–8.0)

## 2020-11-28 LAB — PHOSPHORUS: Phosphorus: 3.6 mg/dL (ref 2.5–4.6)

## 2020-11-28 LAB — AMMONIA: Ammonia: <9 umol/L — ABNORMAL LOW (ref 9–35)

## 2020-11-28 LAB — RESP PANEL BY RT-PCR (FLU A&B, COVID) ARPGX2
Influenza A by PCR: NEGATIVE
Influenza B by PCR: NEGATIVE
SARS Coronavirus 2 by RT PCR: NEGATIVE

## 2020-11-28 LAB — RPR: RPR Ser Ql: NONREACTIVE

## 2020-11-28 LAB — MAGNESIUM: Magnesium: 2.1 mg/dL (ref 1.7–2.4)

## 2020-11-28 MED ORDER — POLYETHYLENE GLYCOL 3350 17 G PO PACK
17.0000 g | PACK | Freq: Every day | ORAL | Status: DC
  Administered 2020-11-28 – 2020-11-29 (×2): 17 g via ORAL
  Filled 2020-11-28 (×2): qty 1

## 2020-11-28 NOTE — Progress Notes (Signed)
Physical Therapy Treatment Patient Details Name: Chelsea Moss MRN: [id number] DOB: Oct 06, 1926 Today's Date: 11/28/2020    History of Present Illness 87 y.o. female presents to [facility] ED on 11/19/2020 with confusion, hypertension, and edema. Pt admitted for hypertensive emergency and acute metabolic encephalopathy. PMH includes hypothyroidism, essential hypertension, chronic kidney disease stage IIIb carotid artery disease status post endarterectomy chronic diastolic heart failure.    PT Comments    Pt continues to demonstrate decreased activity tolerance during PT sessions. Pt with significant orthostatic hypotension this session with reports of lightheadedness, feeling hot and clammy. Pt also continues to report back and multi-joint pain with all mobility. Pt continues to need limited physical assistance for transfers and ambulation to maintain safety. PT encourages the patient to mobilize more frequently with assistance of nursing staff, pt reporting only walking when PT has been present. PT encourages ambulation at least 3 times daily to improve activity tolerance and reduce back pain. PT also provides HEP to improve LE strength and reduce pain. Pt will benefit from being seen by acute PT services as much as possible due to progressive decline in mobility since admission date. PT continues to recommend HHPT with assistance from family for all out of bed mobility. If family does not feel capable of providing this level of assistance then pt will likely benefit from SNF placement.   Follow Up Recommendations  Home health PT;Supervision/Assistance - 24 hour     Equipment Recommendations  None recommended by PT    Recommendations for Other Services       Precautions / Restrictions Precautions Precautions: Fall Restrictions Weight Bearing Restrictions: No    Mobility  Bed Mobility Overal bed mobility: Needs Assistance Bed Mobility: Supine to Sit     Supine to sit: Min assist;HOB  elevated     General bed mobility comments: use of bed rails    Transfers Overall transfer level: Needs assistance Equipment used: Rolling walker (2 wheeled) Transfers: Sit to/from Stand Sit to Stand: Min assist         General transfer comment: cues for hand placement, anterior trunk lean and rocking  Ambulation/Gait Ambulation/Gait assistance: Min assist Gait Distance (Feet): 20 Feet (additional trial of 15') Assistive device: Rolling walker (2 wheeled) Gait Pattern/deviations: Step-through pattern Gait velocity: reduced Gait velocity interpretation: <1.8 ft/sec, indicate of risk for recurrent falls General Gait Details: pt with slowed step-through gait, PT cues to maintain RW closer to BOS and reduce trunk flexion   Stairs             Wheelchair Mobility    Modified Rankin (Stroke Patients Only)       Balance Overall balance assessment: Needs assistance Sitting-balance support: No upper extremity supported;Feet supported Sitting balance-Leahy Scale: Fair     Standing balance support: Single extremity supported Standing balance-Leahy Scale: Poor Standing balance comment: reliant on unilateral UE support of walker                            Cognition Arousal/Alertness: Awake/alert Behavior During Therapy: Anxious Overall Cognitive Status: Impaired/Different from baseline Area of Impairment: Attention;Awareness;Problem solving                   Current Attention Level: Focused (easily distracted)       Awareness: Emergent Problem Solving: Requires verbal cues        Exercises General Exercises - Lower Extremity Ankle Circles/Pumps: AROM;Both;20 reps Long Arc Quad: AROM;Both;10 reps Heel Slides: AROM;Both;5  reps Hip ABduction/ADduction: AROM;Both;10 reps;Seated Hip Flexion/Marching: AROM;Both;10 reps;Seated    General Comments General comments (skin integrity, edema, etc.): pt with orthostatic hypotension with mobility  this session, initial BP in supine 183/58 (95), BP after initial bout of ambulation 92/51 (62), BP after ambulating 2nd time 96/42 (58), final BP in recliner with legs elevated 89/39 (56), RN aware      Pertinent Vitals/Pain Pain Assessment: Faces Faces Pain Scale: Hurts whole lot Pain Location: back, neck, shoulders Pain Descriptors / Indicators: Aching Pain Intervention(s): Monitored during session    Home Living                      Prior Function            PT Goals (current goals can now be found in the care plan section) Acute Rehab PT Goals Patient Stated Goal: to improve mobility and go home Progress towards PT goals: Progressing toward goals    Frequency    Min 3X/week (see as much as possible, slow decline since admission)      PT Plan Current plan remains appropriate    Co-evaluation              AM-PAC PT "6 Clicks" Mobility   Outcome Measure  Help needed turning from your back to your side while in a flat bed without using bedrails?: A Little Help needed moving from lying on your back to sitting on the side of a flat bed without using bedrails?: A Little Help needed moving to and from a bed to a chair (including a wheelchair)?: A Little Help needed standing up from a chair using your arms (e.g., wheelchair or bedside chair)?: A Little Help needed to walk in hospital room?: A Little Help needed climbing 3-5 steps with a railing? : A Lot 6 Click Score: 17    End of Session   Activity Tolerance: Patient limited by fatigue;Treatment limited secondary to medical complications (Comment) (orthostatic hypotension) Patient left: in chair;with call bell/phone within reach;with chair alarm set;with family/visitor present Nurse Communication: Mobility status PT Visit Diagnosis: Unsteadiness on feet (R26.81);Other abnormalities of gait and mobility (R26.89);Muscle weakness (generalized) (M62.81)     Time: 8182-9937 PT Time Calculation (min) (ACUTE  ONLY): 49 min  Charges:  $Gait Training: 8-22 mins $Therapeutic Exercise: 8-22 mins $Therapeutic Activity: 8-22 mins                     Arlyss Gandy, PT, DPT Acute Rehabilitation Pager: [phone]    Arlyss Gandy 11/28/2020, 1:30 PM

## 2020-11-28 NOTE — [place] FL2 (Addendum)
[place] MEDICAID FL2 LEVEL OF CARE SCREENING TOOL     IDENTIFICATION  Patient Name: Chelsea Moss Birthdate: 10-Apr-1927 Sex: female Admission Date (Current Location): 11/19/2020  [facility] and [place] Number:  Producer, television/film/video and Address:  [facility], [address] [place], [place] [address]      Provider Number: [id number]  Attending Physician Name and Address:  Drema Dallas, MD  Relative Name and Phone Number:  Scharlene, Catalina (Daughter)   [phone]    Current Level of Care: Hospital Recommended Level of Care: Skilled Nursing Facility Prior Approval Number:    Date Approved/Denied:   PASRR Number:  [id number]  Discharge Plan: SNF    Current Diagnoses: Patient Active Problem List   Diagnosis Date Noted   Hypertensive urgency 11/20/2020   Hypertensive emergency 11/19/2020   Chronic heart failure with preserved ejection fraction (HCC) 11/11/2020   Lacunar stroke (HCC) 11/11/2020   Advanced age 87/25/2021   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 07/07/2019   Bilateral carotid artery stenosis 07/07/2019   Gait disturbance 07/07/2019   History of CEA (carotid endarterectomy) 07/07/2019   Hx-TIA (transient ischemic attack) 07/07/2019   Stage 3b chronic kidney disease (HCC) 07/07/2019   Statin intolerance 07/07/2019   Chronic bilateral low back pain without sciatica 08/14/2016   Iron deficiency anemia 06/25/2016   Advance directive on file 04/21/2016   Macular degeneration 02/29/2016   Left tibialis posterior tendinitis 12/23/2014   Essential hypertension 08/12/2014   Menopausal symptoms 02/04/2014   Hyperlipidemia, unspecified 01/06/2012   Osteopenia 01/06/2012   Renal insufficiency 01/06/2012   Acquired hypothyroidism 12/10/2011    Orientation RESPIRATION BLADDER Height & Weight     Self, Time, Situation, Place  Normal Continent, External catheter Weight: 152 lb 8.9 oz (69.2 kg) Height:  4\' 11"  (149.9 cm)   BEHAVIORAL SYMPTOMS/MOOD NEUROLOGICAL BOWEL NUTRITION STATUS      Continent Diet (See DC Summary)  AMBULATORY STATUS COMMUNICATION OF NEEDS Skin   Limited Assist Verbally                         Personal Care Assistance Level of Assistance  Bathing, Feeding, Dressing Bathing Assistance: Limited assistance Feeding assistance: Independent Dressing Assistance: Limited assistance     Functional Limitations Info  Sight, Speech, Hearing Sight Info: Impaired Hearing Info: Impaired Speech Info: Adequate    SPECIAL CARE FACTORS FREQUENCY  PT (By licensed PT), OT (By licensed OT)     PT Frequency: 5x a week OT Frequency: 5x a week            Contractures Contractures Info: Not present    Additional Factors Info  Code Status, Allergies Code Status Info: DNR Allergies Info: Statins           Current Medications (11/28/2020):  This is the current hospital active medication list Current Facility-Administered Medications  Medication Dose Route Frequency Provider Last Rate Last Admin   acetaminophen (TYLENOL) tablet 650 mg  650 mg Oral Q6H PRN 01/29/2021, MD   650 mg at 11/27/20 2217   Or   acetaminophen (TYLENOL) suppository 650 mg  650 mg Rectal Q6H PRN 2218, MD       amLODipine (NORVASC) tablet 10 mg  10 mg Oral Daily Synetta Fail, MD   10 mg at 11/28/20 0948   aspirin EC tablet 81 mg  81 mg Oral Daily Bhagat, Srishti L, MD   81 mg at  11/28/20 0948   busPIRone (BUSPAR) tablet 5 mg  5 mg Oral BID Marinda Elk, MD   5 mg at 11/28/20 0948   carvedilol (COREG) tablet 3.125 mg  3.125 mg Oral BID WC Dow Adolph N, DO   3.125 mg at 11/28/20 0948   clopidogrel (PLAVIX) tablet 75 mg  75 mg Oral QHS Synetta Fail, MD   75 mg at 11/27/20 2210   enoxaparin (LOVENOX) injection 30 mg  30 mg Subcutaneous Daily Marinda Elk, MD   30 mg at 11/28/20 0947   ferrous sulfate tablet 325 mg  325 mg Oral Q lunch Synetta Fail, MD    325 mg at 11/28/20 0948   gabapentin (NEURONTIN) capsule 300 mg  300 mg Oral QHS Synetta Fail, MD   300 mg at 11/27/20 2210   hydrALAZINE (APRESOLINE) injection 5 mg  5 mg Intravenous Q4H PRN Marinda Elk, MD   5 mg at 11/27/20 0450   hydrALAZINE (APRESOLINE) tablet 100 mg  100 mg Oral TID PRN Marinda Elk, MD   100 mg at 11/23/20 0343   hydrALAZINE (APRESOLINE) tablet 5 mg  5 mg Oral Q8H Drema Dallas, MD   5 mg at 11/28/20 9509   irbesartan (AVAPRO) tablet 300 mg  300 mg Oral Daily Marinda Elk, MD   300 mg at 11/28/20 0947   isosorbide mononitrate (IMDUR) 24 hr tablet 60 mg  60 mg Oral Daily Drema Dallas, MD   60 mg at 11/28/20 0950   levothyroxine (SYNTHROID) tablet 25 mcg  25 mcg Oral QAC breakfast Synetta Fail, MD   25 mcg at 11/28/20 0628   pantoprazole (PROTONIX) EC tablet 40 mg  40 mg Oral q morning Synetta Fail, MD   40 mg at 11/28/20 0948   polyethylene glycol (MIRALAX / GLYCOLAX) packet 17 g  17 g Oral Daily PRN Synetta Fail, MD   17 g at 11/27/20 1312   polyethylene glycol (MIRALAX / GLYCOLAX) packet 17 g  17 g Oral Daily Drema Dallas, MD   17 g at 11/28/20 1229   sodium chloride flush (NS) 0.9 % injection 3 mL  3 mL Intravenous Q12H Synetta Fail, MD   3 mL at 11/28/20 0950   traMADol (ULTRAM) tablet 50 mg  50 mg Oral Q12H Drema Dallas, MD   50 mg at 11/28/20 3267     Discharge Medications: Please see discharge summary for a list of discharge medications.  Relevant Imaging Results:  Relevant Lab Results:   Additional Information SSN: [id number]  Ivette Loyal, [place]

## 2020-11-28 NOTE — Progress Notes (Signed)
PROGRESS NOTE    Chelsea Moss  ZOX:[id number] DOB: 1926-12-01 DOA: 11/19/2020 PCP: Rolm Gala, NP     Brief Narrative:  Chelsea Moss is an 87 y.o. WF PMHx Hypothyroidism, essential HTN, CAD, chronic diastolic CHF, CAD s/p  endarterectomy, CKD stage IIIb,   Presents to the ED with confusion elevated blood pressure and edema in the ED she was found to have a blood pressure systolic of 190 sodium 131, creatinine 1.3, mild leukocytosis with left shift lactic acid 1.1   Subjective: 7/11 A/O x4, states work with PT today   Assessment & Plan: Covid vaccination; vaccinated 1/3.  States does not want remainder of vaccinations because she is 87 years old.   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 -2D echo was done that showed a preserved EF grade 1 diastolic heart failure no wall motion abnormalities. -7/8 orthostatic vitals every shift -Physical therapy evaluated the patient recommended home health PT.   Acute metabolic encephalopathy: -Has had recurrent hypothermia Cytogeneticist.PRN   Leukocytosis: -Resolved likely stress demargination   Iron deficiency anemia: -Hemoglobin dropped 11. To 8.9, no signs of overt bleeding recheck a CBC today. Lab Results  Component Value Date   HGB 8.5 (L) 11/29/2020   HGB 8.8 (L) 11/28/2020   HGB 9.0 (L) 11/27/2020   HGB 9.6 (L) 11/26/2020   HGB 10.3 (L) 11/25/2020  ]  Hypothyroidism: -Continue Synthroid.  Acute on CKD Stage  IIIb: (Baseline Cr 1.3) Lab Results  Component Value Date   CREATININE 1.19 (H) 11/27/2020   CREATININE 1.07 (H) 11/26/2020   CREATININE 1.06 (H) 11/25/2020   CREATININE 1.14 (H) 11/24/2020   CREATININE 1.38 (H) 11/23/2020  - Resolved  Chronic back pain - Normally on Diclofenac 75 mg BID, not ordered I am assuming secondary to her acute on CKD stage IIIb.  Would restart once patient's renal function returns to normal. - 7/10 Tramadol 50 mg BID PRN: After long discussion with son and daughter concerning risk factors, as well as risk factors of other medications were willing to accept that this could lower seizure threshold.   -SNF   History of CVA/mild confusion: -Continue statin and Plavix. -7/10 EEG:no seizure or seizure predisposition recorded on this study. -7/10 given patient's SSX this a.m. neurology recommends following # Transient episode of altered awareness, favor TIA in the setting of severe basilar stenosis -Dual antiplatelet therapy for 90 days, adding aspirin 81 mg to be stopped in 90 days, continue home Plavix -Lipid panel, A1c, (goal A1c less than 7%, goal LDL less than 70.  May need PSK 9 inhibitor  alirocumab (Praluent) and evolocumab (Repatha).  Although recommended by neurology not carried in the pharmacy.  Therefore we will start patient on Zetia for HLD control. - At follow-up with neurologist if they truly want patient started on this new medication may have to provide her samples. -Zetia 10 mg daily -Hemoglobin A1c goal <7: Patient at goal   unable to take statins secondary to myalgias  -Schedule follow-up  with Dr.Srishti Bhagat neurology in 2 to 4 weeks.  -Blood pressure goal 140-160 for now     # Bilateral ICA aneurysms - Outpatient follow-up with [facility]surgery [facility]   # Chronic subdural - Outpatient follow-up with primary care physician   # Mild confusion -Check B12, HIV, RPR, ammonia for reversible causes,  primary team to treat as needed.  Note, goal B12 level greater than 400, plan to start oral supplementation if low normal (standard IM repletion if very low)        DVT prophylaxis: Lovenox Code Status: DNR Family Communication: 7/11 spoke with daughter Chelsea Moss at bedside extensively, discussed plan of care answered all questions Status is: Inpatient    Dispo: The patient is from:               Anticipated d/c is to:               Anticipated d/c date is:               Patient currently       Consultants:    Procedures/Significant Events:  7/9 MRI brain W. Wo contrast:No acute infarct. 2. Thin (approximately 2 mm thick) right cerebral convexity subdural collection, which was likely present on CT from November 19, 2020. This likely represents a small chronic subdural hemorrhage. No significant mass effect. 3. Small remote right thalamic lacunar infarct and mild chronic microvascular ischemic disease. 7/9 MRI head W0 contrast: Negative intracranial MRA for large vessel occlusion. 2. 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 3. Severe proximal right P2 stenosis, with additional bilateral severe distal P3 stenoses versus occlusions as above. 4. Short-segment mild to moderate proximal right M1 stenosis. 5. 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 7/10 EEG:no seizure or seizure predisposition recorded on this study.  I have personally reviewed and interpreted all radiology studies and my findings are as above.  VENTILATOR SETTINGS:    Cultures   Antimicrobials:    Devices    LINES / TUBES:      Continuous Infusions:   Objective: Vitals:   11/27/20 1115 11/27/20 2211 11/28/20 0419 11/28/20 1056  BP: (!) 160/44 (!) 164/45 (!) 161/56 (!) 157/54  Pulse: 67 70 70 74  Resp: 17 13 17 16   Temp: 98 F (36.7 C) 98.3 F (36.8 C) 98.9 F (37.2 C) 98.7 F (37.1 C)  TempSrc: Oral Oral Oral  Oral  SpO2: 95% 97% 94% 96%  Weight:   69.2 kg   Height:        Intake/Output Summary (Last 24 hours) at 11/28/2020 1609 Last data filed at 11/28/2020 1606 Gross per 24 hour  Intake 357 ml  Output 1200 ml  Net -843 ml    Filed Weights   11/26/20 0459 11/27/20 0431 11/28/20 0419  Weight: 69.2 kg 69.5 kg 69.2 kg    Examination:  General: Sleeping restfully, No acute respiratory distress Eyes: negative scleral hemorrhage, negative anisocoria, negative icterus ENT: Negative Runny nose, negative gingival bleeding, Neck:  Negative scars, masses, torticollis, lymphadenopathy, JVD Lungs: Clear to auscultation bilaterally without wheezes or crackles Cardiovascular: Regular rate and rhythm without murmur gallop or rub normal S1 and S2 Abdomen: negative abdominal pain, nondistended, positive soft, bowel sounds, no rebound, no ascites, no appreciable mass Extremities: No significant cyanosis, clubbing, or edema bilateral lower extremities Musculoskeletal; pain to palpation lower back. Skin: Negative rashes, lesions, ulcers Psychiatric:  Negative depression, negative anxiety, negative fatigue,  negative mania  Central nervous system:  Cranial nerves II through XII intact, tongue/uvula midline, all extremities muscle strength 5/5, sensation intact throughout, negative dysarthria, negative expressive aphasia, negative receptive aphasia.  .     Data Reviewed: Care during the described time interval was provided by me .  I have reviewed this patient's available data, including medical history, events of note, physical examination, and all test results as part of my evaluation.  CBC: Recent Labs  Lab 11/24/20 0353 11/25/20 0525 11/26/20 0546 11/27/20 0302 11/28/20 0442  WBC 9.9 11.8* 10.6* 9.1 9.8  NEUTROABS 7.4 10.0* 7.9* 6.5 7.5  HGB 9.7* 10.3* 9.6* 9.0* 8.8*  HCT 28.6* 30.4* 28.6* 26.8* 26.2*  MCV 98.3 96.8 98.3 98.2 98.1  PLT 311 306 296 339 342    Basic Metabolic  Panel: Recent Labs  Lab 11/23/20 1436 11/24/20 0353 11/25/20 0525 11/26/20 0546 11/27/20 0302 11/28/20 0442  NA 133* 134* 133* 132* 132*  --   K 3.7 3.5 4.1 3.9 4.1  --   CL 95* 97* 98 97* 97*  --   CO2 --   GLUCOSE 187* 98 113* 100* 100*  --   BUN 24*  --   CREATININE 1.38* 1.14* 1.06* 1.07* 1.19*  --   CALCIUM 8.8* 8.8* 8.8* 8.8* 9.2  --   MG 1.9 2.1 2.0 2.1 2.0 2.1  PHOS 2.8 3.0 2.6 3.4 2.9 3.6    GFR: Estimated Creatinine Clearance: 25.5 mL/min (A) (by C-G formula based on SCr of 1.19 mg/dL (H)). Liver Function Tests: Recent Labs  Lab 11/23/20 1436 11/24/20 0353 11/25/20 0525 11/26/20 0546 11/27/20 0302  AST ALT ALKPHOS 46 41 46 52 56  BILITOT 0.7 0.8 0.7 0.8 0.8  PROT 6.0* 5.7* 5.9* 6.0* 5.6*  ALBUMIN 3.1* 2.9* 2.9* 2.9* 2.7*    No results for input(s): LIPASE, AMYLASE in the last 168 hours. Recent Labs  Lab 11/27/20 1459 11/28/20 0442  AMMONIA 11 <9*   Coagulation Profile: No results for input(s): INR, PROTIME in the last 168 hours. Cardiac Enzymes: No results for input(s): CKTOTAL, CKMB, CKMBINDEX, TROPONINI in the last 168 hours. BNP (last 3 results) No results for input(s): PROBNP in the last 8760 hours. HbA1C: Recent Labs    11/27/20 1146  HGBA1C 5.7*    CBG: No results for input(s): GLUCAP in the last 168 hours.  Lipid Profile: Recent Labs    11/27/20 1146  CHOL 244*  HDL 101  LDLCALC 130*  TRIG 66  CHOLHDL 2.4    Thyroid Function Tests: No results for input(s): TSH, T4TOTAL, FREET4, T3FREE, THYROIDAB in the last 72 hours. Anemia Panel: Recent Labs    11/27/20 1146  VITAMINB12 188    Sepsis Labs: No results for input(s): PROCALCITON, LATICACIDVEN in the last 168 hours.   Recent Results (from the past 240 hour(s))  Urine culture     Status: None   Collection Time: 11/19/20  2:42 PM   Specimen: Urine, Random  Result Value Ref Range Status   Specimen  Description URINE, RANDOM  Final   Special Requests NONE  Final   Culture   Final    NO GROWTH Performed at [facility], [address], [place], [place] [address]    Report Status 11/21/2020 FINAL  Final  Blood culture (routine x 2)     Status: None   Collection  Time: 11/19/20  6:25 PM   Specimen: BLOOD  Result Value Ref Range Status   Specimen Description BLOOD SITE NOT SPECIFIED  Final   Special Requests   Final    BOTTLES DRAWN AEROBIC AND ANAEROBIC Blood Culture adequate volume   Culture   Final    NO GROWTH 5 DAYS Performed at [facility], [address], [place], [place] [address]    Report Status 11/24/2020 FINAL  Final  Blood culture (routine x 2)     Status: None   Collection Time: 11/19/20  6:26 PM   Specimen: BLOOD  Result Value Ref Range Status   Specimen Description BLOOD SITE NOT SPECIFIED  Final   Special Requests   Final    BOTTLES DRAWN AEROBIC AND ANAEROBIC Blood Culture adequate volume   Culture   Final    NO GROWTH 5 DAYS Performed at [facility], [address] [place], [place] [address]    Report Status 11/24/2020 FINAL  Final  Resp Panel by RT-PCR (Flu A&B, Covid) Nasopharyngeal Swab     Status: None   Collection Time: 11/19/20  6:26 PM   Specimen: Nasopharyngeal Swab; Nasopharyngeal(NP) swabs in vial transport medium  Result Value Ref Range Status   SARS Coronavirus 2 by RT PCR NEGATIVE NEGATIVE Final    Comment: (NOTE) SARS-CoV-2 target nucleic acids are NOT DETECTED.  The SARS-CoV-2 RNA is generally detectable in upper respiratory specimens during the acute phase of infection. The lowest concentration of SARS-CoV-2 viral copies this assay can detect is 138 copies/mL. A negative result does not preclude SARS-Cov-2 infection and should not be used as the sole basis for treatment or other patient management decisions. A negative result may occur with  improper specimen collection/handling, submission of specimen other than  nasopharyngeal swab, presence of viral mutation(s) within the areas targeted by this assay, and inadequate number of viral copies(<138 copies/mL). A negative result must be combined with clinical observations, patient history, and epidemiological information. The expected result is Negative.  Fact Sheet for Patients:  BloggerCourse.com  Fact Sheet for Healthcare Providers:  SeriousBroker.it  This test is no t yet approved or cleared by the [place] FDA and  has been authorized for detection and/or diagnosis of SARS-CoV-2 by FDA under an Emergency Use Authorization (EUA). This EUA will remain  in effect (meaning this test can be used) for the duration of the COVID-19 declaration under Section 564(b)(1) of the Act, 21 U.S.C.section 360bbb-3(b)(1), unless the authorization is terminated  or revoked sooner.       Influenza A by PCR NEGATIVE NEGATIVE Final   Influenza B by PCR NEGATIVE NEGATIVE Final    Comment: (NOTE) The Xpert Xpress SARS-CoV-2/FLU/RSV plus assay is intended as an aid in the diagnosis of influenza from Nasopharyngeal swab specimens and should not be used as a sole basis for treatment. Nasal washings and aspirates are unacceptable for Xpert Xpress SARS-CoV-2/FLU/RSV testing.  Fact Sheet for Patients: BloggerCourse.com  Fact Sheet for Healthcare Providers: SeriousBroker.it  This test is not yet approved or cleared by the [place] FDA and has been authorized for detection and/or diagnosis of SARS-CoV-2 by FDA under an Emergency Use Authorization (EUA). This EUA will remain in effect (meaning this test can be used) for the duration of the COVID-19 declaration under Section 564(b)(1) of the Act, 21 U.S.C. section 360bbb-3(b)(1), unless the authorization is terminated or revoked.  Performed at [facility], [address], [place], [place] [address]  Radiology Studies: MR ANGIO HEAD WO CONTRAST  Result Date: 11/26/2020 CLINICAL DATA:  Follow-up examination for stroke. EXAM: MRA HEAD WITHOUT CONTRAST TECHNIQUE: Angiographic images of the Circle of Willis were acquired using MRA technique without intravenous contrast. COMPARISON:  Comparison made with corresponding brain MRI from the same day. FINDINGS: Anterior circulation: Examination mildly degraded by motion artifact. Visualized distal cervical segments of the internal carotid arteries are patent with antegrade flow. Petrous segments patent bilaterally. Atheromatous irregularity seen throughout the carotid siphons with associated mild to moderate multifocal narrowing. 5 mm focal outpouching extending posteriorly from the supraclinoid right ICA consistent with an aneurysm (series 5, image 96). In additional 4 mm focal outpouching extending posteriorly from the supraclinoid left ICA also consistent with an aneurysm (series 5, image 99). A1 segments patent bilaterally. Normal anterior communicating artery complex. Anterior cerebral arteries patent to their distal aspects without stenosis. Left M1 segment widely patent. Short-segment mild to moderate proximal right M1 stenosis (series 1021, image 9). MCA bifurcations within normal limits. Distal MCA branches well perfused and symmetric, although demonstrates small vessel atheromatous irregularity. Posterior circulation: Vertebral arteries are largely codominant and patent to the vertebrobasilar junction without stenosis. Left PICA origin patent and normal. Right PICA not definitely seen. Short-segment severe stenosis noted involving the mid basilar artery (series 1027, image 12). Basilar patent distally. Superior cerebral arteries patent bilaterally. Left PCA primarily supplied via the basilar. Right PCA supplied via the basilar as well as a prominent right posterior communicating artery. Atheromatous irregularity within the left P1 and P2  segments without high-grade stenosis. Severe distal left P3 stenosis versus occlusion (series 10/27, image 16). On the right, there is a focal severe proximal right P2 stenosis (series 1033, image 10). Additional severe stenosis versus occlusion involving the distal right P3 branch (series 1027, image 15). Anatomic variants: None significant. Other: None. IMPRESSION: 1. Negative intracranial MRA for large vessel occlusion. 2. 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 3. Severe proximal right P2 stenosis, with additional bilateral severe distal P3 stenoses versus occlusions as above. 4. Short-segment mild to moderate proximal right M1 stenosis. 5. 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 Electronically Signed   By: Rise Mu M.D.   On: 11/26/2020 19:15   MR BRAIN WO CONTRAST  Result Date: 11/26/2020 CLINICAL DATA:  Neuro deficit, acute stroke suspected. EXAM: MRI HEAD WITHOUT CONTRAST TECHNIQUE: Multiplanar, multiecho pulse sequences of the brain and surrounding structures were obtained without intravenous contrast. COMPARISON:  CT head 11/19/2020. FINDINGS: Brain: Thin (approximately 2 mm thick) right cerebral convexity subdural collection, which in retrospect was likely present on CT from November 19, 2020. No significant mass effect. No acute infarct. Moderate generalized atrophy with ex vacuo ventricular dilation. No hydrocephalus. Mild for age scattered T2/FLAIR hyperintensities within the white matter, compatible with chronic microvascular ischemic disease. Suspected small remote right thalamic lacunar infarct. No midline shift. Basal cisterns are patent. Vascular: See concurrent MRA for further evaluation. Skull and upper cervical spine: Normal marrow signal. Sinuses/Orbits: Small amount of fluid layering in the right sphenoid sinus. Otherwise, clear sinuses. Unremarkable orbits. Other: No mastoid effusions. IMPRESSION: 1. No acute  infarct. 2. Thin (approximately 2 mm thick) right cerebral convexity subdural collection, which was likely present on CT from November 19, 2020. This likely represents a small chronic subdural hemorrhage. No significant mass effect. 3. Small remote right thalamic lacunar infarct and mild chronic microvascular ischemic disease. Electronically Signed   By: Feliberto Harts MD  On: 11/26/2020 18:30   EEG adult  Result Date: 11/27/2020 Rejeana Brock, MD     11/27/2020  4:04 PM History: 87 year old female being evaluated for transient episode of behavioral arrest Sedation: None Technique: This is a 21 channel routine scalp EEG performed at the bedside with bipolar and monopolar montages arranged in accordance to the international 10/20 system of electrode placement. One channel was dedicated to EKG recording. Background: The background consists of intermixed alpha and beta activities. There is a well defined posterior dominant rhythm of nine hz that attenuates with eye opening. Sleep is recorded with normal appearing structures. Photic stimulation: Physiologic driving is present EEG Abnormalities: None Clinical Interpretation: This normal EEG is recorded in the waking and sleep state. There was no seizure or seizure predisposition recorded on this study. Please note that lack of epileptiform activity on EEG does not preclude the possibility of epilepsy. Ritta Slot, MD [facility] [phone] If 7pm- 7am, please page neurology on call as listed in AMION.   VAS US CAROTID  Result Date: 11/26/2020 Carotid Arterial Duplex Study Patient Name:  Chelsea Moss  Date of Exam:   11/26/2020 Medical Rec #: [id number]        Accession #:    [id number] Date of Birth: 12/31/1926        Patient Gender: F Patient Age:   89Y Exam Location:  [facility] Procedure:      VAS US CAROTID Referring Phys: [id number] MCNEILL P KIRKPATRICK  --------------------------------------------------------------------------------  Indications:       Carotid artery disease and right endarterectomy 8/28/20015 Risk Factors:      Hypertension, past history of smoking. Comparison Study:  No prior study on file Performing Technologist: Sherren Kerns RVS  Examination Guidelines: A complete evaluation includes B-mode imaging, spectral Doppler, color Doppler, and power Doppler as needed of all accessible portions of each vessel. Bilateral testing is considered an integral part of a complete examination. Limited examinations for reoccurring indications may be performed as noted.  Right Carotid Findings: +----------+--------+--------+--------+------------------+------------------+           PSV cm/sEDV cm/sStenosisPlaque DescriptionComments           +----------+--------+--------+--------+------------------+------------------+ CCA Prox  111     21                                intimal thickening +----------+--------+--------+--------+------------------+------------------+ CCA Distal109     16                                intimal thickening +----------+--------+--------+--------+------------------+------------------+ ICA Prox  99      22      1-39%   heterogenous      Patent CEA         +----------+--------+--------+--------+------------------+------------------+ ICA Mid   172     40                                                   +----------+--------+--------+--------+------------------+------------------+ ICA Distal177     40                                                   +----------+--------+--------+--------+------------------+------------------+  ECA       160     4                                                    +----------+--------+--------+--------+------------------+------------------+ +----------+--------+-------+--------+-------------------+           PSV cm/sEDV cmsDescribeArm Pressure (mmHG)  +----------+--------+-------+--------+-------------------+ [id number]                                        +----------+--------+-------+--------+-------------------+ +---------+--------+--+--------+--+ VertebralPSV cm/s85EDV cm/s12 +---------+--------+--+--------+--+ Elevated velocities mid and distal ICA Left Carotid Findings: +----------+--------+--------+--------+------------------+------------------+           PSV cm/sEDV cm/sStenosisPlaque DescriptionComments           +----------+--------+--------+--------+------------------+------------------+ CCA Prox  138     17                                intimal thickening +----------+--------+--------+--------+------------------+------------------+ CCA Distal117     21                                intimal thickening +----------+--------+--------+--------+------------------+------------------+ ICA Prox  126     27      1-39%   heterogenous                         +----------+--------+--------+--------+------------------+------------------+ ICA Mid   99      17                                                   +----------+--------+--------+--------+------------------+------------------+ ICA Distal106     29                                                   +----------+--------+--------+--------+------------------+------------------+ ECA       212     13                                                   +----------+--------+--------+--------+------------------+------------------+ +----------+--------+--------+--------+-------------------+           PSV cm/sEDV cm/sDescribeArm Pressure (mmHG) +----------+--------+--------+--------+-------------------+ [id number]                                         +----------+--------+--------+--------+-------------------+ +---------+--------+--+--------+--+ VertebralPSV cm/s99EDV cm/s18 +---------+--------+--+--------+--+   Summary: Right  Carotid: Velocities in the right ICA are consistent with a 1-39% stenosis. Left Carotid: Velocities in the left ICA are consistent with a 1-39% stenosis. Vertebrals:  Bilateral vertebral arteries demonstrate antegrade flow. Subclavians: Normal flow hemodynamics were seen in bilateral subclavian              arteries. *  See table(s) above for measurements and observations.     Preliminary         Scheduled Meds:  amLODipine  10 mg Oral Daily   aspirin EC  81 mg Oral Daily   busPIRone  5 mg Oral BID   carvedilol  3.125 mg Oral BID WC   clopidogrel  75 mg Oral QHS   enoxaparin (LOVENOX) injection  30 mg Subcutaneous Daily   ferrous sulfate  325 mg Oral Q lunch   gabapentin  300 mg Oral QHS   hydrALAZINE  5 mg Oral Q8H   irbesartan  300 mg Oral Daily   isosorbide mononitrate  60 mg Oral Daily   levothyroxine  25 mcg Oral QAC breakfast   pantoprazole  40 mg Oral q morning   polyethylene glycol  17 g Oral Daily   sodium chloride flush  3 mL Intravenous Q12H   traMADol  50 mg Oral Q12H   Continuous Infusions:   LOS: 8 days    Time spent:40 min    Ladarious Kresse, Roselind Messier, MD [facility]   If 7PM-7AM, please contact night-coverage 11/28/2020, 4:09 PM

## 2020-11-28 NOTE — Plan of Care (Signed)
  Problem: Education: Goal: Knowledge of General Education information will improve Description: Including pain rating scale, medication(s)/side effects and non-pharmacologic comfort measures Outcome: Progressing   Problem: Health Behavior/Discharge Planning: Goal: Ability to manage health-related needs will improve Outcome: Progressing   Problem: Clinical Measurements: Goal: Ability to maintain clinical measurements within normal limits will improve Outcome: Progressing Goal: Will remain free from infection Outcome: Progressing Goal: Diagnostic test results will improve Outcome: Progressing Goal: Respiratory complications will improve Outcome: Progressing Goal: Cardiovascular complication will be avoided Outcome: Progressing   Problem: Activity: Goal: Risk for activity intolerance will decrease Outcome: Progressing   Problem: Coping: Goal: Level of anxiety will decrease Outcome: Progressing   Problem: Elimination: Goal: Will not experience complications related to bowel motility Outcome: Progressing Goal: Will not experience complications related to urinary retention Outcome: Progressing   Problem: Safety: Goal: Ability to remain free from injury will improve Outcome: Progressing   Problem: Skin Integrity: Goal: Risk for impaired skin integrity will decrease Outcome: Progressing   Problem: Education: Goal: Ability to demonstrate management of disease process will improve Outcome: Progressing Goal: Ability to verbalize understanding of medication therapies will improve Outcome: Progressing Goal: Individualized Educational Video(s) Outcome: Progressing   Problem: Activity: Goal: Capacity to carry out activities will improve Outcome: Progressing   Problem: Cardiac: Goal: Ability to achieve and maintain adequate cardiopulmonary perfusion will improve Outcome: Progressing   

## 2020-11-28 NOTE — Care Management Important Message (Signed)
Important Message  Patient Details  Name: Chelsea Moss MRN: [id number] Date of Birth: November 05, 1926   Medicare Important Message Given:  Yes     Renie Ora 11/28/2020, 9:51 AM

## 2020-11-28 NOTE — Progress Notes (Signed)
Neurology Progress Note  Patient ID: Chelsea Moss is a 87 y.o. female admitted with hypertensive urgency, history of right carotid endarterectomy, who was doing well 7/9 morning and in her normal mental state.  Suddenly at 11 AM, the patient had a mental status change.  She had been interactive and appropriate, but then the nurse describes that she was staring past her, not moving her eyes and not responding.  She gradually improved, and when the nurse asked her to follow commands, she would do so but it took her a good 10 minutes before she came back relatively normal.  Patient reported that she did have some difficulty speaking but there was also a brief period of time she was amnestic to and reportedly had a TIA several years ago that may have been very similar  Subjective: -Feels must better after moving around, tolerating more movement with the resumption of tramadol per daughter   Current Facility-Administered Medications:    acetaminophen (TYLENOL) tablet 650 mg, 650 mg, Oral, Q6H PRN, 650 mg at 11/27/20 2217 **OR** acetaminophen (TYLENOL) suppository 650 mg, 650 mg, Rectal, Q6H PRN, Synetta Fail, MD   amLODipine (NORVASC) tablet 10 mg, 10 mg, Oral, Daily, Marinda Elk, MD, 10 mg at 11/27/20 1102   aspirin EC tablet 81 mg, 81 mg, Oral, Daily, Tylique Aull L, MD, 81 mg at 11/27/20 1102   busPIRone (BUSPAR) tablet 5 mg, 5 mg, Oral, BID, Marinda Elk, MD, 5 mg at 11/27/20 2210   carvedilol (COREG) tablet 3.125 mg, 3.125 mg, Oral, BID WC, Hall, Carole N, DO, 3.125 mg at 11/27/20 1547   clopidogrel (PLAVIX) tablet 75 mg, 75 mg, Oral, QHS, Synetta Fail, MD, 75 mg at 11/27/20 2210   enoxaparin (LOVENOX) injection 30 mg, 30 mg, Subcutaneous, Daily, David Stall, Darin Engels, MD, 30 mg at 11/27/20 1104   ferrous sulfate tablet 325 mg, 325 mg, Oral, Q lunch, Synetta Fail, MD, 325 mg at 11/27/20 1547   gabapentin (NEURONTIN) capsule 300 mg, 300 mg, Oral, QHS,  Synetta Fail, MD, 300 mg at 11/27/20 2210   hydrALAZINE (APRESOLINE) injection 5 mg, 5 mg, Intravenous, Q4H PRN, Marinda Elk, MD, 5 mg at 11/27/20 0450   hydrALAZINE (APRESOLINE) tablet 100 mg, 100 mg, Oral, TID PRN, Marinda Elk, MD, 100 mg at 11/23/20 0343   hydrALAZINE (APRESOLINE) tablet 5 mg, 5 mg, Oral, Q8H, Drema Dallas, MD, 5 mg at 11/28/20 7619   irbesartan (AVAPRO) tablet 300 mg, 300 mg, Oral, Daily, David Stall, Darin Engels, MD, 300 mg at 11/27/20 1102   isosorbide mononitrate (IMDUR) 24 hr tablet 60 mg, 60 mg, Oral, Daily, Drema Dallas, MD, 60 mg at 11/27/20 1102   levothyroxine (SYNTHROID) tablet 25 mcg, 25 mcg, Oral, QAC breakfast, Synetta Fail, MD, 25 mcg at 11/28/20 0628   pantoprazole (PROTONIX) EC tablet 40 mg, 40 mg, Oral, q morning, Synetta Fail, MD, 40 mg at 11/27/20 1103   polyethylene glycol (MIRALAX / GLYCOLAX) packet 17 g, 17 g, Oral, Daily PRN, Synetta Fail, MD, 17 g at 11/27/20 1312   silver sulfADIAZINE (SILVADENE) 1 % cream, , Topical, Daily, Drema Dallas, MD, Given at 11/27/20 1105   sodium chloride flush (NS) 0.9 % injection 3 mL, 3 mL, Intravenous, Q12H, Synetta Fail, MD, 3 mL at 11/27/20 2221   traMADol (ULTRAM) tablet 50 mg, 50 mg, Oral, Q12H, Drema Dallas, MD, 50 mg at 11/27/20 2210   Exam: Current vital signs: BP Marland Kitchen)  161/56 (BP Location: Right Arm)   Pulse 70   Temp 98.9 F (37.2 C) (Oral)   Resp 17   Ht 4\' 11"  (1.499 m)   Wt 69.2 kg   SpO2 94%   BMI 30.81 kg/m  Vital signs in last 24 hours: Temp:  [98 F (36.7 C)-98.9 F (37.2 C)] 98.9 F (37.2 C) (07/11 0419) Pulse Rate:  [67-70] 70 (07/11 0419) Resp:  [13-17] 17 (07/11 0419) BP: (160-164)/(44-56) 161/56 (07/11 0419) SpO2:  [94 %-97 %] 94 % (07/11 0419) Weight:  [69.2 kg] 69.2 kg (07/11 0419)   Gen: In bed, comfortable  Resp: non-labored breathing, no grossly audible wheezing Cardiac: Perfusing extremities well  Abd: soft,  nt  Neuro:  Mental Status: Patient is awake, alert, oriented to person, place, month, year, and situation. Patient gives a much clear history today and speech is less hesitant.  She does accidentally call [facility] "core" hospital but is oriented to [facility] Cranial Nerves: II: Visual Fields are full. Pupils are equal, round, and reactive to light.   III,IV, VI: EOMI without ptosis or diploplia. VII: Facial movement is symmetric. VIII: hearing is intact to voice Motor: She is moving bilateral upper extremities freely antigravity, gesturing while she talks  Pertinent Labs:  Basic Metabolic Panel: Recent Labs  Lab 11/23/20 1436 11/24/20 0353 11/25/20 0525 11/26/20 0546 11/27/20 0302 11/28/20 0442  NA 133* 134* 133* 132* 132*  --   K 3.7 3.5 4.1 3.9 4.1  --   CL 95* 97* 98 97* 97*  --   CO2 28 30 27 27 28   --   GLUCOSE 187* 98 113* 100* 100*  --   BUN 21 19 19 22  24*  --   CREATININE 1.38* 1.14* 1.06* 1.07* 1.19*  --   CALCIUM 8.8* 8.8* 8.8* 8.8* 9.2  --   MG 1.9 2.1 2.0 2.1 2.0 2.1  PHOS 2.8 3.0 2.6 3.4 2.9 3.6    GFR: 43  Estimated Creatinine Clearance: 25.5 mL/min (A) (by C-G formula based on SCr of 1.19 mg/dL (H)).   CBC: Recent Labs  Lab 11/24/20 0353 11/25/20 0525 11/26/20 0546 11/27/20 0302 11/28/20 0442  WBC 9.9 11.8* 10.6* 9.1 9.8  NEUTROABS 7.4 10.0* 7.9* 6.5 7.5  HGB 9.7* 10.3* 9.6* 9.0* 8.8*  HCT 28.6* 30.4* 28.6* 26.8* 26.2*  MCV 98.3 96.8 98.3 98.2 98.1  PLT 311 306 296 339 342    Lab Results  Component Value Date   CHOL 244 (H) 11/27/2020   HDL 101 11/27/2020   LDLCALC 130 (H) 11/27/2020   TRIG 66 11/27/2020   CHOLHDL 2.4 11/27/2020    Lab Results  Component Value Date   HGBA1C 5.7 (H) 11/27/2020     Lab Results  Component Value Date   VITAMINB12 188 11/27/2020     Lab Results  Component Value Date   TSH 3.590 11/19/2020    HIV negative Ammonia 9   No results found for: RPR (pending)  MRI brain personally  reviewed:  1. No acute infarct. 2. Thin (approximately 2 mm thick) right cerebral convexity subdural collection, which was likely present on CT from November 19, 2020. This likely represents a small chronic subdural hemorrhage. No significant mass effect. 3. Small remote right thalamic lacunar infarct and mild chronic microvascular ischemic disease.  MRA brain personally reviewed:  IMPRESSION: 1. Negative intracranial MRA for large vessel occlusion. 2. 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 3. Severe proximal right P2 stenosis, with additional bilateral  severe distal P3 stenoses versus occlusions as above. 4. Short-segment mild to moderate proximal right M1 stenosis. 5. 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  Cartoid duplex:  Right Carotid: Velocities in the right ICA are consistent with a 1-39%  stenosis.  Left Carotid: Velocities in the left ICA are consistent with a 1-39%  stenosis.  Vertebrals:  Bilateral vertebral arteries demonstrate antegrade flow.  Subclavians: Normal flow hemodynamics were seen in bilateral subclavian               arteries.    ECHO 11/20/2020  1. Left ventricular ejection fraction, by estimation, is 65 to 70%. The  left ventricle has normal function. The left ventricle has no regional  wall motion abnormalities. Left ventricular diastolic parameters are  consistent with Grade I diastolic  dysfunction (impaired relaxation).   2. Right ventricular systolic function is normal. The right ventricular  size is normal.   3. The mitral valve is normal in structure. Trivial mitral valve  regurgitation. No evidence of mitral stenosis.   4. The aortic valve is tricuspid. There is mild calcification of the  aortic valve. There is moderate thickening of the aortic valve. Aortic  valve regurgitation is trivial. Mild to moderate aortic valve  sclerosis/calcification is present, without any  evidence  of aortic stenosis.  -Normal biatrial sizes  EEG read: Background: The background consists of intermixed alpha and beta activities. There is a well defined posterior dominant rhythm of nine hz that attenuates with eye opening. Sleep is recorded with normal appearing structures. Photic stimulation: Physiologic driving is present EEG Abnormalities: None  Impression: This is a pleasant 87 year old woman with a past medical history as detailed above, undergoing neurological work-up for transient alteration in mental status.  She was initially admitted with hypertensive urgency and then noted to have mental status change in the setting of transient hypotension.  There was initial concern for complex partial seizure versus TIA versus global hypoperfusion.  Given the severe stenosis revealed on MRA brain, I favor hypoperfusion, and EEG read is reassuringly negative for epileptogenic activity.  Plan as below.  Recommendations:  # Transient episode of altered awareness, favor TIA in the setting of severe basilar stenosis -Dual antiplatelet therapy for 90 days, adding aspirin 81 mg to be stopped in 21-90 days, continue home Plavix -Lipid panel, A1c, (goal A1c less than 7%, goal LDL less than 70.  May need PSK 9 inhibitor truly unable to take statins secondary to myalgias  -Discussed at length with daughter, will hold off on statin for now given she is just recovering from her chronic pain, and plan to start this on an outpatient basis versus possibly Repatha or other PSK 9 inhibitor -Blood pressure goal 140-160 due to severe intracranial stenosis -No indication for antiseizure medications; agree with resuming tramadol given low concern for seizure as the initial event and given how beneficial this medication is in controlling the patient's pain and allowing her to function -[facility] placed for appointment within 4 to 10 weeks  # Bilateral ICA aneurysms -  Outpatient follow-up with for monitoring  # Chronic subdural - Outpatient follow-up with primary care physician  # Back pain  - PT / OT   # Mild confusion -Checked B12, HIV, RPR, ammonia for reversible causes, primary team to treat as needed.  Given borderline low B12 at 188, started supplementation,   Brooke DareSrishti Oreoluwa Aigner MD-PhD [facility] [phone]  Available 7 AM to  7 PM, outside these hours please contact Neurologist on call listed on AMION   Greater than 35 minutes were spent in care of this patient today, greater than 50% at bedside including extensive discussion with the daughter reviewing neurological work-up as detailed above and plan.

## 2020-11-28 NOTE — TOC Initial Note (Addendum)
Transition of Care [facility]) - Initial/Assessment Note    Patient Details  Name: Sidonia Nutter MRN: [id number] Date of Birth: Mar 31, 1927  Transition [facility]) CM/SW Contact:    Lynett Grimes Phone Number: 11/28/2020, 1:03 PM  Clinical Narrative:                 1303: CSW attempted to contact pt daughter, no answer CSW will follow up with daughter to discuss SNF at DC.  1424: Pt daughter contacted CSW back and stated that pt is from [facility] and can go for SNF. CSW contacted French Ana at [facility] to confirm bed placement, French Ana will look over pt information and CSW will start auth.   1510: Auth started ref# [id number]       Patient Goals and CMS Choice        Expected Discharge Plan and Services                                                Prior Living Arrangements/Services                       Activities of Daily Living Home Assistive Devices/Equipment: Cane (specify quad or straight), Dentures (specify type), Eyeglasses, Walker (specify type) ADL Screening (condition at time of admission) Patient's cognitive ability adequate to safely complete daily activities?: No Is the patient deaf or have difficulty hearing?: No Does the patient have difficulty seeing, even when wearing glasses/contacts?: No Does the patient have difficulty concentrating, remembering, or making decisions?: Yes Patient able to express need for assistance with ADLs?: Yes Does the patient have difficulty dressing or bathing?: Yes Independently performs ADLs?: No Does the patient have difficulty walking or climbing stairs?: Yes Weakness of Legs: Both Weakness of Arms/Hands: Both  Permission Sought/Granted                  Emotional Assessment              Admission diagnosis:  Hypertensive urgency [I16.0] Hypertensive emergency [I16.1] Patient Active Problem List   Diagnosis Date Noted   Hypertensive urgency 11/20/2020   Hypertensive emergency  11/19/2020   Chronic heart failure with preserved ejection fraction (HCC) 11/11/2020   Lacunar stroke (HCC) 11/11/2020   Advanced age 37/25/2021   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 07/07/2019   Bilateral carotid artery stenosis 07/07/2019   Gait disturbance 07/07/2019   History of CEA (carotid endarterectomy) 07/07/2019   Hx-TIA (transient ischemic attack) 07/07/2019   Stage 3b chronic kidney disease (HCC) 07/07/2019   Statin intolerance 07/07/2019   Chronic bilateral low back pain without sciatica 08/14/2016   Iron deficiency anemia 06/25/2016   Advance directive on file 04/21/2016   Macular degeneration 02/29/2016   Left tibialis posterior tendinitis 12/23/2014   Essential hypertension 08/12/2014   Menopausal symptoms 02/04/2014   Hyperlipidemia, unspecified 01/06/2012   Osteopenia 01/06/2012   Renal insufficiency 01/06/2012   Acquired hypothyroidism 12/10/2011   PCP:  Rolm Gala, NP Pharmacy:   [facility] [address] [place] - [address] [place] [address] Phone: [phone] Fax: [phone]  [facility]  [facility]) - [place], [place] - [address] [place] [address] Phone: [phone] Fax: [phone]     Social  Determinants of Health (SDOH) Interventions    Readmission Risk Interventions No flowsheet data found.

## 2020-11-29 ENCOUNTER — Other Ambulatory Visit (HOSPITAL_COMMUNITY): Payer: Self-pay

## 2020-11-29 DIAGNOSIS — G450 Vertebro-basilar artery syndrome: Secondary | ICD-10-CM

## 2020-11-29 DIAGNOSIS — D508 Other iron deficiency anemias: Secondary | ICD-10-CM

## 2020-11-29 DIAGNOSIS — I651 Occlusion and stenosis of basilar artery: Secondary | ICD-10-CM

## 2020-11-29 LAB — CBC WITH DIFFERENTIAL/PLATELET
Abs Immature Granulocytes: 0.06 10*3/uL (ref 0.00–0.07)
Basophils Absolute: 0 10*3/uL (ref 0.0–0.1)
Basophils Relative: 0 %
Eosinophils Absolute: 0.2 10*3/uL (ref 0.0–0.5)
Eosinophils Relative: 2 %
HCT: 25.7 % — ABNORMAL LOW (ref 36.0–46.0)
Hemoglobin: 8.5 g/dL — ABNORMAL LOW (ref 12.0–15.0)
Immature Granulocytes: 1 %
Lymphocytes Relative: 14 %
Lymphs Abs: 1.4 10*3/uL (ref 0.7–4.0)
MCH: 32.6 pg (ref 26.0–34.0)
MCHC: 33.1 g/dL (ref 30.0–36.0)
MCV: 98.5 fL (ref 80.0–100.0)
Monocytes Absolute: 0.9 10*3/uL (ref 0.1–1.0)
Monocytes Relative: 10 %
Neutro Abs: 7.3 10*3/uL (ref 1.7–7.7)
Neutrophils Relative %: 73 %
Platelets: 367 10*3/uL (ref 150–400)
RBC: 2.61 MIL/uL — ABNORMAL LOW (ref 3.87–5.11)
RDW: 12.7 % (ref 11.5–15.5)
WBC: 9.8 10*3/uL (ref 4.0–10.5)
nRBC: 0 % (ref 0.0–0.2)

## 2020-11-29 LAB — PHOSPHORUS: Phosphorus: 3 mg/dL (ref 2.5–4.6)

## 2020-11-29 LAB — MAGNESIUM: Magnesium: 1.9 mg/dL (ref 1.7–2.4)

## 2020-11-29 MED ORDER — POLYETHYLENE GLYCOL 3350 17 GM/SCOOP PO POWD
17.0000 g | Freq: Every day | ORAL | 0 refills | Status: AC | PRN
  Filled 2020-11-29: qty 510, 30d supply, fill #0

## 2020-11-29 MED ORDER — VITAMIN B-12 1000 MCG PO TABS: 500.0000 ug | ORAL_TABLET | Freq: Every day | ORAL | Status: DC

## 2020-11-29 MED ORDER — ISOSORBIDE MONONITRATE ER 60 MG PO TB24
60.0000 mg | ORAL_TABLET | Freq: Every day | ORAL | 0 refills | Status: AC
  Filled 2020-11-29: qty 30, 30d supply, fill #0

## 2020-11-29 MED ORDER — CARVEDILOL 3.125 MG PO TABS
3.1250 mg | ORAL_TABLET | Freq: Two times a day (BID) | ORAL | 0 refills | Status: AC
  Filled 2020-11-29: qty 60, 30d supply, fill #0

## 2020-11-29 MED ORDER — TRAMADOL HCL 50 MG PO TABS: 50.0000 mg | ORAL_TABLET | Freq: Two times a day (BID) | ORAL | 0 refills | Status: AC

## 2020-11-29 MED ORDER — HYDRALAZINE HCL 10 MG PO TABS
5.0000 mg | ORAL_TABLET | Freq: Three times a day (TID) | ORAL | 0 refills | Status: AC
  Filled 2020-11-29: qty 90, 60d supply, fill #0

## 2020-11-29 MED ORDER — VITAMIN B-12 1000 MCG PO TABS
500.0000 ug | ORAL_TABLET | Freq: Every day | ORAL | 0 refills | Status: AC
  Filled 2020-11-29: qty 30, 60d supply, fill #0

## 2020-11-29 MED ORDER — ASPIRIN 81 MG PO TBEC
81.0000 mg | DELAYED_RELEASE_TABLET | Freq: Every day | ORAL | 0 refills | Status: AC
Start: 2020-11-30 — End: ?
  Filled 2020-11-29: qty 30, 30d supply, fill #0

## 2020-11-29 MED ORDER — EZETIMIBE 10 MG PO TABS
10.0000 mg | ORAL_TABLET | Freq: Every day | ORAL | 0 refills | Status: AC
  Filled 2020-11-29: qty 30, 30d supply, fill #0

## 2020-11-29 MED ORDER — ASCORBIC ACID 500 MG PO TABS
250.0000 mg | ORAL_TABLET | Freq: Every day | ORAL | 0 refills | Status: AC
  Filled 2020-11-29: qty 30, 60d supply, fill #0

## 2020-11-29 MED ORDER — EZETIMIBE 10 MG PO TABS
10.0000 mg | ORAL_TABLET | Freq: Every day | ORAL | Status: DC
  Administered 2020-11-29: 10 mg via ORAL
  Filled 2020-11-29: qty 1

## 2020-11-29 MED ORDER — IRBESARTAN 300 MG PO TABS
300.0000 mg | ORAL_TABLET | Freq: Every day | ORAL | 0 refills | Status: AC
  Filled 2020-11-29: qty 30, 30d supply, fill #0

## 2020-11-29 NOTE — TOC Transition Note (Addendum)
Transition of Care [facility]) - CM/SW Discharge Note   Patient Details  Name: Chelsea Moss MRN: [id number] Date of Birth: 1927-03-28  Transition of Care [facility]) CM/SW Contact:  Lynett Grimes Phone Number: 11/29/2020, 10:25 AM   Clinical Narrative:    Patient will DC to: [facility] Anticipated DC date: 11/29/2020 Family notified: Pt Daughter Transport by: Sharin Mons   Per MD patient ready for DC to [facility] room 706. RN to call report prior to discharge [phone]). RN, patient, patient's family, and facility notified of DC. Discharge Summary and FL2 sent to facility. DC packet on chart. Ambulance transport requested for patient by nurse [phone].   CSW will sign off for now as social work intervention is no longer needed. Please consult [place] again if new needs arise.           Patient Goals and CMS Choice        Discharge Placement                       Discharge Plan and Services                                     Social Determinants of Health (SDOH) Interventions     Readmission Risk Interventions No flowsheet data found.

## 2020-11-29 NOTE — Plan of Care (Signed)
°  Problem: Education: °Goal: Knowledge of General Education information will improve °Description: Including pain rating scale, medication(s)/side effects and non-pharmacologic comfort measures °Outcome: Adequate for Discharge °  °Problem: Health Behavior/Discharge Planning: °Goal: Ability to manage health-related needs will improve °Outcome: Adequate for Discharge °  °Problem: Clinical Measurements: °Goal: Ability to maintain clinical measurements within normal limits will improve °Outcome: Adequate for Discharge °Goal: Will remain free from infection °Outcome: Adequate for Discharge °Goal: Diagnostic test results will improve °Outcome: Adequate for Discharge °Goal: Respiratory complications will improve °Outcome: Adequate for Discharge °Goal: Cardiovascular complication will be avoided °Outcome: Adequate for Discharge °  °Problem: Activity: °Goal: Risk for activity intolerance will decrease °Outcome: Adequate for Discharge °  °Problem: Coping: °Goal: Level of anxiety will decrease °Outcome: Adequate for Discharge °  °Problem: Elimination: °Goal: Will not experience complications related to bowel motility °Outcome: Adequate for Discharge °Goal: Will not experience complications related to urinary retention °Outcome: Adequate for Discharge °  °Problem: Safety: °Goal: Ability to remain free from injury will improve °Outcome: Adequate for Discharge °  °Problem: Skin Integrity: °Goal: Risk for impaired skin integrity will decrease °Outcome: Adequate for Discharge °  °Problem: Education: °Goal: Ability to demonstrate management of disease process will improve °Outcome: Adequate for Discharge °Goal: Ability to verbalize understanding of medication therapies will improve °Outcome: Adequate for Discharge °Goal: Individualized Educational Video(s) °Outcome: Adequate for Discharge °  °Problem: Activity: °Goal: Capacity to carry out activities will improve °Outcome: Adequate for Discharge °  °Problem: Cardiac: °Goal: Ability  to achieve and maintain adequate cardiopulmonary perfusion will improve °Outcome: Adequate for Discharge °  °

## 2020-11-29 NOTE — Discharge Summary (Signed)
Physician Discharge Summary  Chelsea Moss ZOX:[id number] DOB: [birth year] DOA: 11/19/2020  PCP: Rolm Gala, NP  Admit date: 11/19/2020 Discharge date: 11/29/2020  Time spent: 35 minutes  Recommendations for Outpatient Follow-up:   Covid vaccination; vaccinated 1/3.  States does not want remainder of vaccinations because she is 87 years old.   HTN Emergency: -Amlodipine 10 mg daily - Coreg 3.125 mg BID - 7/8 hydralazine 5 mg TID -Irbesartan 300 mg daily - 7/8 increase Imdur 60 mg daily -Question likely due to noncompliance. -2D echo was done that showed a preserved EF grade 1 diastolic heart failure no wall motion abnormalities. -7/8 orthostatic vitals every shift -Physical therapy evaluated the patient recommended home health PT.   Acute metabolic encephalopathy: - Resolved  -7/10 B12 1000 mcg IM x1 - B12 500 mcg daily - Per neurology have scheduled Schedule follow-up with Dr.Srishti Bhagat neurology in 2 to 4 weeks.  May check levels at that time.  Hx CVA -Patient statin intolerant -Continue statin and Plavix. -7/10 EEG:no seizure or seizure predisposition recorded on this study. -7/10 given patient's SSX this a.m. neurology recommends following  Transient episode of altered awareness, favor TIA in the setting of severe basilar stenosis -Dual antiplatelet therapy for 90 days, adding aspirin 81 mg to be stopped in 90 days, continue home Plavix -Lipid panel, A1c, (goal A1c less than 7%, goal LDL less than 70.  May need PSK 9 inhibitor  alirocumab (Praluent) and evolocumab (Repatha).  Although recommended by neurology not carried in the pharmacy.  Therefore we will start patient on Zetia for HLD control. - At follow-up with neurologist if they truly want patient started on this new medication may have to provide her samples. -Zetia 10 mg daily -Hemoglobin A1c goal <7: Patient at goal  Bilateral ICA aneurysms - When patient follows up with Dr.Srishti [facility] neurology will  determine if neurosurgery consult warranted.   Leukocytosis: -Resolved likely stress demargination   Iron deficiency anemia: -Hemoglobin dropped 11. To 8.9, no signs of overt bleeding  Lab Results  Component Value Date   HGB 8.5 (L) 11/29/2020   HGB 8.8 (L) 11/28/2020   HGB 9.0 (L) 11/27/2020   HGB 9.6 (L) 11/26/2020   HGB 10.3 (L) 11/25/2020  -Stable  Hypothyroidism: - Synthroid 25 mcg daily.  Acute on CKD Stage IIIb: (Baseline Cr 1.3) Lab Results  Component Value Date   CREATININE 1.19 (H) 11/27/2020   CREATININE 1.07 (H) 11/26/2020   CREATININE 1.06 (H) 11/25/2020   CREATININE 1.14 (H) 11/24/2020   CREATININE 1.38 (H) 11/23/2020  -Better than baseline  Chronic back pain - Normally on Diclofenac 75 mg BID, not ordered I am assuming secondary to her acute on CKD stage IIIb.  Would restart once patient's renal function returns to normal. - 7/10 Tramadol 50 mg BID PRN: After long discussion with son and daughter concerning risk factors, as well as risk factors of other medications were willing to accept that this could lower seizure threshold.   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 WF PMHx Hypothyroidism, essential HTN, CAD, chronic diastolic CHF, CAD s/p  endarterectomy, CKD stage IIIb,   Presents to the ED with confusion elevated blood pressure and edema in the ED she was found to have a  blood pressure systolic of 190 sodium 131, creatinine 1.3, mild leukocytosis with left shift lactic acid 1.1  Hospital Course:  See above  Procedures: 7/9 MRI brain W. Wo contrast:No acute infarct. 2. Thin (approximately 2 mm thick) right cerebral convexity subdural collection, which was likely present on CT from November 19, 2020. This likely represents a small chronic subdural hemorrhage. No significant mass effect. 3. Small remote right thalamic lacunar infarct and mild chronic microvascular ischemic disease. 7/9 MRI head W0 contrast: Negative intracranial MRA for large vessel occlusion. 2. 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 3. Severe proximal right P2 stenosis, with additional bilateral severe distal P3 stenoses versus occlusions as above. 4. Short-segment mild to moderate proximal right M1 stenosis. 5. 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 7/10 EEG:no seizure or seizure predisposition recorded on this study.   Consultations: Neurology   Cultures  7/10 RPR nonreactive 7/10 HIV nonreactive   Antibiotics Anti-infectives (From admission, onward)    Start     Ordered Stop   11/20/20 1000  cephALEXin (KEFLEX) capsule 500 mg  Status:  Discontinued        11/19/20 2308 11/20/20 0950   11/20/20 1000  cephALEXin (KEFLEX) capsule 500 mg        11/20/20 0950 11/21/20 2057         Discharge Exam: Vitals:   11/28/20 1056 11/28/20 1943 11/29/20 0353 11/29/20 0500  BP: (!) 157/54 (!) 169/48 (!) 162/57   Pulse: 74 78 66   Resp: Temp: 98.7 F (37.1 C) 97.7 F (36.5 C) 97.9 F (36.6 C)   TempSrc: Oral Oral Oral   SpO2: 96% 95% 95%   Weight:    69.3 kg  Height:        General: A/O x4,, No acute respiratory distress Eyes: negative scleral hemorrhage, negative anisocoria, negative icterus ENT: Negative Runny nose, negative gingival bleeding, Neck:  Negative scars, masses, torticollis,  lymphadenopathy, JVD Lungs: Clear to auscultation bilaterally without wheezes or crackles Cardiovascular: Regular rate and rhythm without murmur gallop or rub normal S1 and S2   Discharge Instructions  Discharge Instructions     Ambulatory referral to Neurology   Complete by: As directed    An appointment is requested in approximately: 4-10 weeks      Allergies as of 11/29/2020       Reactions   Statins Other (See Comments)   musculoskeletal aches and pains, can't take        Medication List     STOP taking these medications    cephALEXin 500 MG capsule Commonly known as: KEFLEX   cloNIDine 0.2 MG tablet Commonly known as: CATAPRES   diclofenac 75 MG EC tablet Commonly known as: VOLTAREN   furosemide 20 MG tablet Commonly known as: LASIX   olmesartan 40 MG tablet Commonly known as: BENICAR Replaced by: irbesartan 300 MG tablet   potassium chloride 10 MEQ CR capsule Commonly known as: MICRO-K   valsartan 160 MG tablet Commonly known as: Diovan       TAKE these medications    acetaminophen  500 MG tablet Commonly known as: TYLENOL Take 1,000 mg by mouth at bedtime.   amLODipine 10 MG tablet Commonly known as: NORVASC Take 10 mg by mouth daily.   aspirin 81 MG EC tablet Take 1 tablet (81 mg total) by mouth daily. Swallow whole. Start taking on: November 30, 2020   BIOTIN PO Take 1 tablet by mouth daily with lunch.   busPIRone 5 MG tablet Commonly known as: BUSPAR Take 5 mg by mouth 2 (two) times daily.   CALCIUM-D PO Take 1 tablet by mouth See admin instructions. 1 tablet by mouth daily with lunch and at bedtime   carvedilol 3.125 MG tablet Commonly known as: COREG Take 1 tablet (3.125 mg total) by mouth 2 (two) times daily with a meal.   clopidogrel 75 MG tablet Commonly known as: PLAVIX Take 75 mg by mouth at bedtime.   diphenhydramine-acetaminophen 25-500 MG Tabs tablet Commonly known as: TYLENOL PM Take 1 tablet by mouth at bedtime as  needed (sleep).   estradiol 2 MG tablet Commonly known as: ESTRACE Take 2 mg by mouth every morning.   ezetimibe 10 MG tablet Commonly known as: ZETIA Take 1 tablet (10 mg total) by mouth daily. Start taking on: November 30, 2020   ferrous sulfate 325 (65 FE) MG tablet Take 325 mg by mouth daily with lunch.   Fish Oil 1000 MG Caps Take 1,000 mg by mouth at bedtime.   gabapentin 600 MG tablet Commonly known as: NEURONTIN Take 300 mg by mouth at bedtime.   hydrALAZINE 10 MG tablet Commonly known as: APRESOLINE Take 0.5 tablets (5 mg total) by mouth every 8 (eight) hours. What changed:  medication strength how much to take when to take this reasons to take this   hydrocortisone 2.5 % cream Apply 1 application topically 2 (two) times daily as needed (itching/rash).   irbesartan 300 MG tablet Commonly known as: AVAPRO Take 1 tablet (300 mg total) by mouth daily. Start taking on: November 30, 2020 Replaces: olmesartan 40 MG tablet   isosorbide mononitrate 60 MG 24 hr tablet Commonly known as: IMDUR Take 1 tablet (60 mg total) by mouth daily. Start taking on: November 30, 2020   levothyroxine 25 MCG tablet Commonly known as: SYNTHROID Take 25 mcg by mouth daily before breakfast.   pantoprazole 40 MG tablet Commonly known as: PROTONIX Take 40 mg by mouth every morning.   polyethylene glycol 17 g packet Commonly known as: MIRALAX / GLYCOLAX Take 17 g by mouth daily as needed for mild constipation.   PreserVision AREDS 2 Caps Take 1 capsule by mouth every morning.   SSD 1 % cream Generic drug: silver sulfADIAZINE Apply 1 application topically daily. Apply to middle to on right foot   traMADol 50 MG tablet Commonly known as: ULTRAM Take 1 tablet (50 mg total) by mouth every 12 (twelve) hours.   triamcinolone cream 0.1 % Commonly known as: KENALOG Apply 1 application topically 2 (two) times daily as needed (itching/rash).   vitamin B-12 500 MCG tablet Commonly known  as: CYANOCOBALAMIN Take 1 tablet (500 mcg total) by mouth daily.   vitamin C 250 MG tablet Commonly known as: ASCORBIC ACID Take 1 tablet (250 mg total) by mouth daily.       Allergies  Allergen Reactions   Statins Other (See Comments)    musculoskeletal aches and pains, can't take    Follow-up Information     Rolm Gala, NP. Go on 12/01/2020.   Specialty: Family Medicine  Why: @3 :20pm Contact information: [address] [place] [phone]                  The results of significant diagnostics from this hospitalization (including imaging, microbiology, ancillary and laboratory) are listed below for reference.    Significant Diagnostic Studies: DG Chest 2 View  Result Date: 11/19/2020 CLINICAL DATA:  Altered mental status EXAM: CHEST - 2 VIEW COMPARISON:  None. FINDINGS: The cardiomediastinal silhouette is unremarkable. There is no evidence of focal airspace disease, pulmonary edema, suspicious pulmonary nodule/mass, pleural effusion, or pneumothorax. No acute bony abnormalities are identified. Lumbar spine surgical hardware noted. IMPRESSION: No active cardiopulmonary disease. Electronically Signed   By: 01/20/2021 M.D.   On: 11/19/2020 15:05   CT Head Wo Contrast  Result Date: 11/19/2020 CLINICAL DATA:  87 year old female with altered mental status. EXAM: CT HEAD WITHOUT CONTRAST TECHNIQUE: Contiguous axial images were obtained from the base of the skull through the vertex without intravenous contrast. COMPARISON:  None. FINDINGS: Brain: No evidence of acute infarction, hemorrhage, hydrocephalus, extra-axial collection or mass lesion/mass effect. Atrophy and chronic small-vessel white ischemic changes are noted. Vascular: Carotid atherosclerotic calcifications are noted. Skull: Normal. Negative for fracture or focal lesion. Sinuses/Orbits: No acute finding. Other: None. IMPRESSION: 1. No evidence of acute intracranial abnormality. 2. Atrophy and  chronic small-vessel white ischemic changes. Electronically Signed   By: 99 M.D.   On: 11/19/2020 18:17   MR ANGIO HEAD WO CONTRAST  Result Date: 11/26/2020 CLINICAL DATA:  Follow-up examination for stroke. EXAM: MRA HEAD WITHOUT CONTRAST TECHNIQUE: Angiographic images of the Circle of Willis were acquired using MRA technique without intravenous contrast. COMPARISON:  Comparison made with corresponding brain MRI from the same day. FINDINGS: Anterior circulation: Examination mildly degraded by motion artifact. Visualized distal cervical segments of the internal carotid arteries are patent with antegrade flow. Petrous segments patent bilaterally. Atheromatous irregularity seen throughout the carotid siphons with associated mild to moderate multifocal narrowing. 5 mm focal outpouching extending posteriorly from the supraclinoid right ICA consistent with an aneurysm (series 5, image 96). In additional 4 mm focal outpouching extending posteriorly from the supraclinoid left ICA also consistent with an aneurysm (series 5, image 99). A1 segments patent bilaterally. Normal anterior communicating artery complex. Anterior cerebral arteries patent to their distal aspects without stenosis. Left M1 segment widely patent. Short-segment mild to moderate proximal right M1 stenosis (series 1021, image 9). MCA bifurcations within normal limits. Distal MCA branches well perfused and symmetric, although demonstrates small vessel atheromatous irregularity. Posterior circulation: Vertebral arteries are largely codominant and patent to the vertebrobasilar junction without stenosis. Left PICA origin patent and normal. Right PICA not definitely seen. Short-segment severe stenosis noted involving the mid basilar artery (series 1027, image 12). Basilar patent distally. Superior cerebral arteries patent bilaterally. Left PCA primarily supplied via the basilar. Right PCA supplied via the basilar as well as a prominent right posterior  communicating artery. Atheromatous irregularity within the left P1 and P2 segments without high-grade stenosis. Severe distal left P3 stenosis versus occlusion (series 10/27, image 16). On the right, there is a focal severe proximal right P2 stenosis (series 1033, image 10). Additional severe stenosis versus occlusion involving the distal right P3 branch (series 1027, image 15). Anatomic variants: None significant. Other: None. IMPRESSION: 1. Negative intracranial MRA for large vessel occlusion. 2. 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 3. Severe proximal right P2 stenosis, with additional bilateral severe distal P3 stenoses versus occlusions as  above. 4. Short-segment mild to moderate proximal right M1 stenosis. 5. 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 Electronically Signed   By: Rise Mu M.D.   On: 11/26/2020 19:15   MR BRAIN WO CONTRAST  Result Date: 11/26/2020 CLINICAL DATA:  Neuro deficit, acute stroke suspected. EXAM: MRI HEAD WITHOUT CONTRAST TECHNIQUE: Multiplanar, multiecho pulse sequences of the brain and surrounding structures were obtained without intravenous contrast. COMPARISON:  CT head 11/19/2020. FINDINGS: Brain: Thin (approximately 2 mm thick) right cerebral convexity subdural collection, which in retrospect was likely present on CT from November 19, 2020. No significant mass effect. No acute infarct. Moderate generalized atrophy with ex vacuo ventricular dilation. No hydrocephalus. Mild for age scattered T2/FLAIR hyperintensities within the white matter, compatible with chronic microvascular ischemic disease. Suspected small remote right thalamic lacunar infarct. No midline shift. Basal cisterns are patent. Vascular: See concurrent MRA for further evaluation. Skull and upper cervical spine: Normal marrow signal. Sinuses/Orbits: Small amount of fluid layering in the right sphenoid sinus. Otherwise, clear sinuses.  Unremarkable orbits. Other: No mastoid effusions. IMPRESSION: 1. No acute infarct. 2. Thin (approximately 2 mm thick) right cerebral convexity subdural collection, which was likely present on CT from November 19, 2020. This likely represents a small chronic subdural hemorrhage. No significant mass effect. 3. Small remote right thalamic lacunar infarct and mild chronic microvascular ischemic disease. Electronically Signed   By: Feliberto Harts MD   On: 11/26/2020 18:30   EEG adult  Result Date: 11/27/2020 Rejeana Brock, MD     11/27/2020  4:04 PM History: 87 year old female being evaluated for transient episode of behavioral arrest Sedation: None Technique: This is a 21 channel routine scalp EEG performed at the bedside with bipolar and monopolar montages arranged in accordance to the international 10/20 system of electrode placement. One channel was dedicated to EKG recording. Background: The background consists of intermixed alpha and beta activities. There is a well defined posterior dominant rhythm of nine hz that attenuates with eye opening. Sleep is recorded with normal appearing structures. Photic stimulation: Physiologic driving is present EEG Abnormalities: None Clinical Interpretation: This normal EEG is recorded in the waking and sleep state. There was no seizure or seizure predisposition recorded on this study. Please note that lack of epileptiform activity on EEG does not preclude the possibility of epilepsy. Ritta Slot, MD [facility] [phone] If 7pm- 7am, please page neurology on call as listed in AMION.   ECHOCARDIOGRAM COMPLETE  Result Date: 11/20/2020    ECHOCARDIOGRAM REPORT   Patient Name:   NICOLLETTE WILHELMI Date of Exam: 11/20/2020 Medical Rec #:  [id number]       Height:       59.0 in Accession #:    [id number]      Weight:       157.0 lb Date of Birth:  [birth year]       BSA:          1.664 m Patient Age:    89 years        BP:           144/48 mmHg Patient  Gender: F               HR:           63 bpm. Exam Location:  Inpatient Procedure: 2D Echo Indications:     congestive heart failure  History:         Patient has no prior history of  Echocardiogram examinations.                  Chronic kidney disease; Risk Factors:Hypertension and                  Dyslipidemia.  Sonographer:     Delcie Roch Referring Phys:  [id number] Cecille Po MELVIN Diagnosing Phys: Laurance Flatten MD IMPRESSIONS  1. Left ventricular ejection fraction, by estimation, is 65 to 70%. The left ventricle has normal function. The left ventricle has no regional wall motion abnormalities. Left ventricular diastolic parameters are consistent with Grade I diastolic dysfunction (impaired relaxation).  2. Right ventricular systolic function is normal. The right ventricular size is normal.  3. The mitral valve is normal in structure. Trivial mitral valve regurgitation. No evidence of mitral stenosis.  4. The aortic valve is tricuspid. There is mild calcification of the aortic valve. There is moderate thickening of the aortic valve. Aortic valve regurgitation is trivial. Mild to moderate aortic valve sclerosis/calcification is present, without any evidence of aortic stenosis. Comparison(s): Compared to prior echo in 12/2019, the visualization on current study is poor. Overall, no significant change. FINDINGS  Left Ventricle: Left ventricular ejection fraction, by estimation, is 65 to 70%. The left ventricle has normal function. The left ventricle has no regional wall motion abnormalities. The left ventricular internal cavity size was normal in size. There is  no left ventricular hypertrophy. Left ventricular diastolic parameters are consistent with Grade I diastolic dysfunction (impaired relaxation). Right Ventricle: The right ventricular size is normal. No increase in right ventricular wall thickness. Right ventricular systolic function is normal. Left Atrium: Left atrial size was normal in size. Right  Atrium: Right atrial size was normal in size. Pericardium: There is no evidence of pericardial effusion. Mitral Valve: The mitral valve is normal in structure. There is mild thickening of the mitral valve leaflet(s). There is mild calcification of the mitral valve leaflet(s). Mild to moderate mitral annular calcification. Trivial mitral valve regurgitation.  No evidence of mitral valve stenosis. Tricuspid Valve: The tricuspid valve is normal in structure. Tricuspid valve regurgitation is trivial. Aortic Valve: The aortic valve is tricuspid. There is mild calcification of the aortic valve. There is moderate thickening of the aortic valve. Aortic valve regurgitation is trivial. Mild to moderate aortic valve sclerosis/calcification is present, without any evidence of aortic stenosis. Pulmonic Valve: The pulmonic valve was normal in structure. Pulmonic valve regurgitation is trivial. Aorta: The aortic root and ascending aorta are structurally normal, with no evidence of dilitation. Venous: The inferior vena cava was not well visualized. IAS/Shunts: No atrial level shunt detected by color flow Doppler.  LEFT VENTRICLE PLAX 2D LVIDd:         4.50 cm  Diastology LVIDs:         2.70 cm  LV e' medial:    6.53 cm/s LV PW:         0.90 cm  LV E/e' medial:  16.7 LV IVS:        0.80 cm  LV e' lateral:   7.40 cm/s LVOT diam:     1.50 cm  LV E/e' lateral: 14.7 LV SV:         55 LV SV Index:   33 LVOT Area:     1.77 cm  RIGHT VENTRICLE RV S prime:     11.40 cm/s TAPSE (M-mode): 2.1 cm LEFT ATRIUM             Index  RIGHT ATRIUM           Index LA diam:        3.60 cm 2.16 cm/m  RA Area:     12.10 cm LA Vol (A2C):   63.6 ml 38.22 ml/m RA Volume:   27.30 ml  16.41 ml/m LA Vol (A4C):   48.5 ml 29.15 ml/m LA Biplane Vol: 55.7 ml 33.47 ml/m  AORTIC VALVE LVOT Vmax:   143.00 cm/s LVOT Vmean:  92.400 cm/s LVOT VTI:    0.309 m  AORTA Ao Root diam: 2.50 cm Ao Asc diam:  2.80 cm MITRAL VALVE MV Area (PHT): 3.60 cm     SHUNTS MV  Decel Time: 211 msec     Systemic VTI:  0.31 m MV E velocity: 109.00 cm/s  Systemic Diam: 1.50 cm MV A velocity: 105.00 cm/s MV E/A ratio:  1.04 Laurance Flatten MD Electronically signed by Laurance Flatten MD Signature Date/Time: 11/20/2020/11:57:17 AM    Final (Updated)    VAS US CAROTID  Result Date: 11/29/2020 Carotid Arterial Duplex Study Patient Name:  BRIEANN OSINSKI  Date of Exam:   11/26/2020 Medical Rec #: [id number]        Accession #:    [id number] Date of Birth: [birth year]        Patient Gender: F Patient Age:   89Y Exam Location:  [facility] Procedure:      VAS US CAROTID Referring Phys: [id number] MCNEILL P KIRKPATRICK --------------------------------------------------------------------------------  Indications:       Carotid artery disease and right endarterectomy 8/28/20015 Risk Factors:      Hypertension, past history of smoking. Comparison Study:  No prior study on file Performing Technologist: Sherren Kerns RVS  Examination Guidelines: A complete evaluation includes B-mode imaging, spectral Doppler, color Doppler, and power Doppler as needed of all accessible portions of each vessel. Bilateral testing is considered an integral part of a complete examination. Limited examinations for reoccurring indications may be performed as noted.  Right Carotid Findings: +----------+--------+--------+--------+------------------+------------------+           PSV cm/sEDV cm/sStenosisPlaque DescriptionComments           +----------+--------+--------+--------+------------------+------------------+ CCA Prox  111     21                                intimal thickening +----------+--------+--------+--------+------------------+------------------+ CCA Distal109     16                                intimal thickening +----------+--------+--------+--------+------------------+------------------+ ICA Prox  99      22      1-39%   heterogenous      Patent CEA          +----------+--------+--------+--------+------------------+------------------+ ICA Mid   172     40                                                   +----------+--------+--------+--------+------------------+------------------+ ICA Distal177     40                                                   +----------+--------+--------+--------+------------------+------------------+  ECA       160     4                                                    +----------+--------+--------+--------+------------------+------------------+ +----------+--------+-------+--------+-------------------+           PSV cm/sEDV cmsDescribeArm Pressure (mmHG) +----------+--------+-------+--------+-------------------+ [id number]                                        +----------+--------+-------+--------+-------------------+ +---------+--------+--+--------+--+ VertebralPSV cm/s85EDV cm/s12 +---------+--------+--+--------+--+ Elevated velocities mid and distal ICA Left Carotid Findings: +----------+--------+--------+--------+------------------+------------------+           PSV cm/sEDV cm/sStenosisPlaque DescriptionComments           +----------+--------+--------+--------+------------------+------------------+ CCA Prox  138     17                                intimal thickening +----------+--------+--------+--------+------------------+------------------+ CCA Distal117     21                                intimal thickening +----------+--------+--------+--------+------------------+------------------+ ICA Prox  126     27      1-39%   heterogenous                         +----------+--------+--------+--------+------------------+------------------+ ICA Mid   99      17                                                   +----------+--------+--------+--------+------------------+------------------+ ICA Distal106     29                                                    +----------+--------+--------+--------+------------------+------------------+ ECA       212     13                                                   +----------+--------+--------+--------+------------------+------------------+ +----------+--------+--------+--------+-------------------+           PSV cm/sEDV cm/sDescribeArm Pressure (mmHG) +----------+--------+--------+--------+-------------------+ [id number]                                         +----------+--------+--------+--------+-------------------+ +---------+--------+--+--------+--+ VertebralPSV cm/s99EDV cm/s18 +---------+--------+--+--------+--+   Summary: Right Carotid: Velocities in the right ICA are consistent with a 1-39% stenosis. Left Carotid: Velocities in the left ICA are consistent with a 1-39% stenosis. Vertebrals:  Bilateral vertebral arteries demonstrate antegrade flow. Subclavians: Normal flow hemodynamics were seen in bilateral subclavian              arteries. *See  table(s) above for measurements and observations.  Electronically signed by Delia Heady MD on 11/29/2020 at 8:07:35 AM.    Final     Microbiology: Recent Results (from the past 240 hour(s))  Urine culture     Status: None   Collection Time: 11/19/20  2:42 PM   Specimen: Urine, Random  Result Value Ref Range Status   Specimen Description URINE, RANDOM  Final   Special Requests NONE  Final   Culture   Final    NO GROWTH Performed at [facility], [address], [place], [place] [address]    Report Status 11/21/2020 FINAL  Final  Blood culture (routine x 2)     Status: None   Collection Time: 11/19/20  6:25 PM   Specimen: BLOOD  Result Value Ref Range Status   Specimen Description BLOOD SITE NOT SPECIFIED  Final   Special Requests   Final    BOTTLES DRAWN AEROBIC AND ANAEROBIC Blood Culture adequate volume   Culture   Final    NO GROWTH 5 DAYS Performed at [facility], [address], [place], [place] [address]     Report Status 11/24/2020 FINAL  Final  Blood culture (routine x 2)     Status: None   Collection Time: 11/19/20  6:26 PM   Specimen: BLOOD  Result Value Ref Range Status   Specimen Description BLOOD SITE NOT SPECIFIED  Final   Special Requests   Final    BOTTLES DRAWN AEROBIC AND ANAEROBIC Blood Culture adequate volume   Culture   Final    NO GROWTH 5 DAYS Performed at [facility], [address], [place], [place] [address]    Report Status 11/24/2020 FINAL  Final  Resp Panel by RT-PCR (Flu A&B, Covid) Nasopharyngeal Swab     Status: None   Collection Time: 11/19/20  6:26 PM   Specimen: Nasopharyngeal Swab; Nasopharyngeal(NP) swabs in vial transport medium  Result Value Ref Range Status   SARS Coronavirus 2 by RT PCR NEGATIVE NEGATIVE Final    Comment: (NOTE) SARS-CoV-2 target nucleic acids are NOT DETECTED.  The SARS-CoV-2 RNA is generally detectable in upper respiratory specimens during the acute phase of infection. The lowest concentration of SARS-CoV-2 viral copies this assay can detect is 138 copies/mL. A negative result does not preclude SARS-Cov-2 infection and should not be used as the sole basis for treatment or other patient management decisions. A negative result may occur with  improper specimen collection/handling, submission of specimen other than nasopharyngeal swab, presence of viral mutation(s) within the areas targeted by this assay, and inadequate number of viral copies(<138 copies/mL). A negative result must be combined with clinical observations, patient history, and epidemiological information. The expected result is Negative.  Fact Sheet for Patients:  BloggerCourse.com  Fact Sheet for Healthcare Providers:  SeriousBroker.it  This test is no t yet approved or cleared by the [place] FDA and  has been authorized for detection and/or diagnosis of SARS-CoV-2 by FDA under an Emergency Use  Authorization (EUA). This EUA will remain  in effect (meaning this test can be used) for the duration of the COVID-19 declaration under Section 564(b)(1) of the Act, 21 U.S.C.section 360bbb-3(b)(1), unless the authorization is terminated  or revoked sooner.       Influenza A by PCR NEGATIVE NEGATIVE Final   Influenza B by PCR NEGATIVE NEGATIVE Final    Comment: (NOTE) The Xpert Xpress SARS-CoV-2/FLU/RSV plus assay is intended as an aid in the  diagnosis of influenza from Nasopharyngeal swab specimens and should not be used as a sole basis for treatment. Nasal washings and aspirates are unacceptable for Xpert Xpress SARS-CoV-2/FLU/RSV testing.  Fact Sheet for Patients: BloggerCourse.com  Fact Sheet for Healthcare Providers: SeriousBroker.it  This test is not yet approved or cleared by the [place] FDA and has been authorized for detection and/or diagnosis of SARS-CoV-2 by FDA under an Emergency Use Authorization (EUA). This EUA will remain in effect (meaning this test can be used) for the duration of the COVID-19 declaration under Section 564(b)(1) of the Act, 21 U.S.C. section 360bbb-3(b)(1), unless the authorization is terminated or revoked.  Performed at [facility], [address], [place], [place] [address]   Resp Panel by RT-PCR (Flu A&B, Covid) Nasopharyngeal Swab     Status: None   Collection Time: 11/28/20  7:41 PM   Specimen: Nasopharyngeal Swab; Nasopharyngeal(NP) swabs in vial transport medium  Result Value Ref Range Status   SARS Coronavirus 2 by RT PCR NEGATIVE NEGATIVE Final    Comment: (NOTE) SARS-CoV-2 target nucleic acids are NOT DETECTED.  The SARS-CoV-2 RNA is generally detectable in upper respiratory specimens during the acute phase of infection. The lowest concentration of SARS-CoV-2 viral copies this assay can detect is 138 copies/mL. A negative result does not preclude SARS-Cov-2 infection  and should not be used as the sole basis for treatment or other patient management decisions. A negative result may occur with  improper specimen collection/handling, submission of specimen other than nasopharyngeal swab, presence of viral mutation(s) within the areas targeted by this assay, and inadequate number of viral copies(<138 copies/mL). A negative result must be combined with clinical observations, patient history, and epidemiological information. The expected result is Negative.  Fact Sheet for Patients:  BloggerCourse.com  Fact Sheet for Healthcare Providers:  SeriousBroker.it  This test is no t yet approved or cleared by the [place] FDA and  has been authorized for detection and/or diagnosis of SARS-CoV-2 by FDA under an Emergency Use Authorization (EUA). This EUA will remain  in effect (meaning this test can be used) for the duration of the COVID-19 declaration under Section 564(b)(1) of the Act, 21 U.S.C.section 360bbb-3(b)(1), unless the authorization is terminated  or revoked sooner.       Influenza A by PCR NEGATIVE NEGATIVE Final   Influenza B by PCR NEGATIVE NEGATIVE Final    Comment: (NOTE) The Xpert Xpress SARS-CoV-2/FLU/RSV plus assay is intended as an aid in the diagnosis of influenza from Nasopharyngeal swab specimens and should not be used as a sole basis for treatment. Nasal washings and aspirates are unacceptable for Xpert Xpress SARS-CoV-2/FLU/RSV testing.  Fact Sheet for Patients: BloggerCourse.com  Fact Sheet for Healthcare Providers: SeriousBroker.it  This test is not yet approved or cleared by the [place] FDA and has been authorized for detection and/or diagnosis of SARS-CoV-2 by FDA under an Emergency Use Authorization (EUA). This EUA will remain in effect (meaning this test can be used) for the duration of the COVID-19 declaration  under Section 564(b)(1) of the Act, 21 U.S.C. section 360bbb-3(b)(1), unless the authorization is terminated or revoked.  Performed at [facility], [address] [place], [place] [address]      Labs: Basic Metabolic Panel: Recent Labs  Lab 11/23/20 1436 11/24/20 0353 11/25/20 0525 11/26/20 0546 11/27/20 0302 11/28/20 0442 11/29/20 0213  NA 133* 134* 133* 132* 132*  --   --   K 3.7 3.5 4.1 3.9 4.1  --   --  CL 95* 97* 98 97* 97*  --   --   CO2 28 30 27 27 28   --   --   GLUCOSE 187* 98 113* 100* 100*  --   --   BUN 21 19 19 22  24*  --   --   CREATININE 1.38* 1.14* 1.06* 1.07* 1.19*  --   --   CALCIUM 8.8* 8.8* 8.8* 8.8* 9.2  --   --   MG 1.9 2.1 2.0 2.1 2.0 2.1 1.9  PHOS 2.8 3.0 2.6 3.4 2.9 3.6 3.0   Liver Function Tests: Recent Labs  Lab 11/23/20 1436 11/24/20 0353 11/25/20 0525 11/26/20 0546 11/27/20 0302  AST 23 23 22 21 18   ALT 15 15 15 17 16   ALKPHOS 46 41 46 52 56  BILITOT 0.7 0.8 0.7 0.8 0.8  PROT 6.0* 5.7* 5.9* 6.0* 5.6*  ALBUMIN 3.1* 2.9* 2.9* 2.9* 2.7*   No results for input(s): LIPASE, AMYLASE in the last 168 hours. Recent Labs  Lab 11/27/20 1459 11/28/20 0442  AMMONIA 11 <9*   CBC: Recent Labs  Lab 11/25/20 0525 11/26/20 0546 11/27/20 0302 11/28/20 0442 11/29/20 0213  WBC 11.8* 10.6* 9.1 9.8 9.8  NEUTROABS 10.0* 7.9* 6.5 7.5 7.3  HGB 10.3* 9.6* 9.0* 8.8* 8.5*  HCT 30.4* 28.6* 26.8* 26.2* 25.7*  MCV 96.8 98.3 98.2 98.1 98.5  PLT 306 296 339 342 367   Cardiac Enzymes: No results for input(s): CKTOTAL, CKMB, CKMBINDEX, TROPONINI in the last 168 hours. BNP: BNP (last 3 results) Recent Labs    11/19/20 1730  BNP 295.1*    ProBNP (last 3 results) No results for input(s): PROBNP in the last 8760 hours.  CBG: No results for input(s): GLUCAP in the last 168 hours.     Signed:  Carolyne Littlesurtis Mykel Sponaugle, MD [facility]

## 2020-11-29 NOTE — TOC Progression Note (Addendum)
Transition of Care [facility]) - Progression Note    Patient Details  Name: Chelsea Moss MRN: [id number] Date of Birth: 1926/09/05  [facility]) CM/SW Contact  Ivette Loyal, [place] Phone Number: 11/29/2020, 10:06 AM  Clinical Narrative:    Pt auth approved ref# [id number] for 3 days, CSW attempted to contact pt daughter to provide update for DC today, no answer CSW left a message. COVID negative        Expected Discharge Plan and Services                                                 Social Determinants of Health (SDOH) Interventions    Readmission Risk Interventions No flowsheet data found.

## 2020-11-30 ENCOUNTER — Telehealth: Payer: Self-pay | Admitting: Cardiovascular Disease

## 2020-11-30 LAB — METHYLMALONIC ACID, SERUM: Methylmalonic Acid, Quantitative: 523 nmol/L — ABNORMAL HIGH (ref 0–378)

## 2020-11-30 NOTE — Telephone Encounter (Signed)
Canceled patient's appointment for carotid. Patient had one done over the weekend. Sent results to Dr. Eden Emms, per Dr. Eden Emms carotids look fine, and patient does not need to follow up with cardiology at this time. Informed patient's daughter (DPR) of Dr. Fabio Bering recommendations. Patient's daughter verbalized understanding.

## 2020-11-30 NOTE — Telephone Encounter (Signed)
New Message"    Patient's daughter wants to know if patient still need Carotid Doppler tomorrow, since she had one she think on 11-26-20?

## 2020-12-01 ENCOUNTER — Ambulatory Visit (HOSPITAL_COMMUNITY)
Admission: RE | Admit: 2020-12-01 | Payer: Medicare Other | Source: Ambulatory Visit | Attending: Cardiovascular Disease | Admitting: Cardiovascular Disease

## 2020-12-01 LAB — URINE CULTURE: Culture: >=100000 — AB

## 2020-12-06 ENCOUNTER — Other Ambulatory Visit: Payer: Self-pay

## 2020-12-06 ENCOUNTER — Encounter: Payer: Self-pay | Admitting: Sports Medicine

## 2020-12-06 ENCOUNTER — Ambulatory Visit (INDEPENDENT_AMBULATORY_CARE_PROVIDER_SITE_OTHER): Payer: Medicare Other | Admitting: Sports Medicine

## 2020-12-06 DIAGNOSIS — L97511 Non-pressure chronic ulcer of other part of right foot limited to breakdown of skin: Secondary | ICD-10-CM | POA: Diagnosis not present

## 2020-12-06 DIAGNOSIS — M2042 Other hammer toe(s) (acquired), left foot: Secondary | ICD-10-CM

## 2020-12-06 DIAGNOSIS — L909 Atrophic disorder of skin, unspecified: Secondary | ICD-10-CM

## 2020-12-06 DIAGNOSIS — M2041 Other hammer toe(s) (acquired), right foot: Secondary | ICD-10-CM

## 2020-12-06 NOTE — Progress Notes (Signed)
Subjective: Chelsea Moss is a 87 y.o. female patient seen in office for follow-up evaluation of ulceration of the right third toe.  Patient is assisted by daughter who reports that it was doing really good until she got hospitalized for 12 days and in the hospital the yellow socks rubbed raw and caused it to start to drain again now this is the first time that it is starting to heal currently using Silvadene and a Band-Aid dressing daily and reports that it is looking better and scabbed over.  Patient currently denies any constitutional symptoms at this time.  Does admit to some soreness to the right third toe but otherwise is doing well.  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 07/07/2019   Bilateral carotid artery stenosis 07/07/2019   Gait disturbance 07/07/2019   History of CEA (carotid endarterectomy) 07/07/2019   Hx-TIA (transient ischemic attack) 07/07/2019   Stage 3b chronic kidney disease (HCC) 07/07/2019   Statin intolerance 07/07/2019   Chronic bilateral low back pain without sciatica 08/14/2016   Iron deficiency anemia 06/25/2016   Advance directive on file 04/21/2016   Macular degeneration 02/29/2016   Left tibialis posterior tendinitis 12/23/2014   Essential hypertension 08/12/2014   Menopausal symptoms 02/04/2014   Hyperlipidemia, unspecified 01/06/2012   Osteopenia 01/06/2012   Renal insufficiency 01/06/2012   Acquired hypothyroidism 12/10/2011   Current Outpatient Medications on File Prior to Visit  Medication Sig Dispense Refill   acetaminophen (TYLENOL) 500 MG tablet Take 1,000 mg by mouth at bedtime.     amLODipine (NORVASC) 10 MG tablet Take 10 mg by mouth daily.     ascorbic acid (VITAMIN C)  500 MG tablet Take 0.5 tablets (250 mg total) by mouth daily. 30 tablet 0   aspirin 81 MG EC tablet Take 1 tablet (81 mg total) by mouth daily. Swallow whole. 30 tablet 0   BIOTIN PO Take 1 tablet by mouth daily with lunch.     busPIRone (BUSPAR) 5 MG tablet Take 5 mg by mouth 2 (two) times daily.     Calcium Carbonate-Vitamin D (CALCIUM-D PO) Take 1 tablet by mouth See admin instructions. 1 tablet by mouth daily with lunch and at bedtime     carvedilol (COREG) 3.125 MG tablet Take 1 tablet (3.125 mg total) by mouth 2 (two) times daily with a meal. 60 tablet 0   clopidogrel (PLAVIX) 75 MG tablet Take 75 mg by mouth at bedtime.     diphenhydramine-acetaminophen (TYLENOL PM) 25-500 MG TABS tablet Take 1 tablet by mouth at bedtime as needed (sleep).     estradiol (ESTRACE) 2 MG tablet Take 2 mg by mouth every morning.     ezetimibe (ZETIA) 10 MG tablet Take 1 tablet (10 mg total) by mouth daily. 30 tablet 0   ferrous sulfate 325 (65 FE) MG tablet Take 325 mg by mouth daily with lunch.     gabapentin (NEURONTIN) 600 MG tablet Take 300 mg by mouth at bedtime.     hydrALAZINE (APRESOLINE) 10 MG tablet Take 0.5 tablets (5 mg total) by mouth every 8 (eight) hours. 90 tablet 0   hydrocortisone 2.5 % cream Apply 1 application topically 2 (two) times daily as needed (itching/rash).     irbesartan (AVAPRO) 300  MG tablet Take 1 tablet (300 mg total) by mouth daily. 30 tablet 0   isosorbide mononitrate (IMDUR) 60 MG 24 hr tablet Take 1 tablet (60 mg total) by mouth daily. 30 tablet 0   levothyroxine (SYNTHROID) 25 MCG tablet Take 25 mcg by mouth daily before breakfast.     Multiple Vitamins-Minerals (PRESERVISION AREDS 2) CAPS Take 1 capsule by mouth every morning.     Omega-3 Fatty Acids (FISH OIL) 1000 MG CAPS Take 1,000 mg by mouth at bedtime.     pantoprazole (PROTONIX) 40 MG tablet Take 40 mg by mouth every morning.     polyethylene glycol powder (GLYCOLAX/MIRALAX) 17 GM/SCOOP powder Take 17 g by mouth  daily as needed for mild constipation. 510 g 0   SSD 1 % cream Apply 1 application topically daily. Apply to middle to on right foot     traMADol (ULTRAM) 50 MG tablet Take 1 tablet (50 mg total) by mouth every 12 (twelve) hours. 7 tablet 0   triamcinolone (KENALOG) 0.1 % Apply 1 application topically 2 (two) times daily as needed (itching/rash).     vitamin B-12 (CYANOCOBALAMIN) 1000 MCG tablet Take 0.5 tablets (500 mcg total) by mouth daily. 30 tablet 0   No current facility-administered medications on file prior to visit.   Allergies  Allergen Reactions   Statins Other (See Comments)    musculoskeletal aches and pains, can't take      Objective: There were no vitals filed for this visit.  General: Patient is awake, alert, oriented x 3 and in no acute distress.  Dermatology: Skin is warm and dry bilateral with a partial thickness ulceration present right third toe distal tuft.  Ulceration measures 0.1 cm x 0. 1 cm x 0.1 cm. There is a keratotic border with a granular base. The ulceration does not  probe to bone. There is no malodor, no active drainage, faint blanchable erythema to the right third toe, localized edema. No other acute signs of infection.   Vascular: Dorsalis Pedis pulse = 1/4 Bilateral,  Posterior Tibial pulse = 0/4 Bilateral,  Capillary Fill Time < 5 seconds  Neurologic: Protective sensation present via light touch bilateral.  Musculosketal: There is mild pain with palpation to ulcerated area.  Hammertoe gross bony deformities noted bilateral.   Recent Labs    11/28/20 1619  LABORGA ESCHERICHIA COLI*    Assessment and Plan:  Problem List Items Addressed This Visit   None Visit Diagnoses     Toe ulcer, right, limited to breakdown of skin (HCC)    -  Primary   Hammer toes of both feet       Plantar fat pad atrophy            -Examined patient and discussed the progression of the wound and treatment alternatives. - Excisionally dedbrided ulceration at  right third toe to healthy bleeding borders removing nonviable tissue using a sterile chisel blade. Wound measures post debridement as above. Wound was debrided to the level of the dermis with viable wound base exposed to promote healing. Hemostasis was achieved with manuel pressure. Patient tolerated procedure well without any discomfort or anesthesia necessary for this wound debridement.  -Applied Silvadene and Band-Aid dressing and advised patient and daughter to do the same at home once twice a week  may remove Band-Aid off at night to leave open to air -Continue to avoid shoes that could rub the toe -Patient to return to office in 2 to 3 weeks for follow up  care and evaluation or sooner if problems arise.   Asencion Islam, DPM

## 2020-12-27 ENCOUNTER — Ambulatory Visit (INDEPENDENT_AMBULATORY_CARE_PROVIDER_SITE_OTHER): Payer: Medicare Other | Admitting: Sports Medicine

## 2020-12-27 ENCOUNTER — Other Ambulatory Visit: Payer: Self-pay

## 2020-12-27 ENCOUNTER — Telehealth: Payer: Self-pay | Admitting: Sports Medicine

## 2020-12-27 ENCOUNTER — Encounter: Payer: Self-pay | Admitting: Sports Medicine

## 2020-12-27 DIAGNOSIS — M79674 Pain in right toe(s): Secondary | ICD-10-CM | POA: Diagnosis not present

## 2020-12-27 DIAGNOSIS — L909 Atrophic disorder of skin, unspecified: Secondary | ICD-10-CM

## 2020-12-27 DIAGNOSIS — M79675 Pain in left toe(s): Secondary | ICD-10-CM

## 2020-12-27 DIAGNOSIS — M2041 Other hammer toe(s) (acquired), right foot: Secondary | ICD-10-CM

## 2020-12-27 DIAGNOSIS — I739 Peripheral vascular disease, unspecified: Secondary | ICD-10-CM

## 2020-12-27 DIAGNOSIS — M2042 Other hammer toe(s) (acquired), left foot: Secondary | ICD-10-CM

## 2020-12-27 DIAGNOSIS — B351 Tinea unguium: Secondary | ICD-10-CM | POA: Diagnosis not present

## 2020-12-27 DIAGNOSIS — L97511 Non-pressure chronic ulcer of other part of right foot limited to breakdown of skin: Secondary | ICD-10-CM

## 2020-12-27 NOTE — Telephone Encounter (Signed)
Pt called stating she called the insurance company and they told her as long as she has an appt for the orthotics they would cover them. I scheduled pt for 9.6 to [place] and confirmed with pt that she wanted to proceed with the orthotics and she said oh yes she wants to proceed with them.

## 2020-12-27 NOTE — Progress Notes (Signed)
Subjective: Chelsea Moss is a 87 y.o. female patient seen in office for follow-up evaluation of ulceration of the right third toe.  Patient is assisted by daughter who reports that toes doing much better no redness no swelling no drainage and has stopped using the Silvadene cream and Band-Aid to the area.  Patient also requests nail trim this visit.  Request to be measured for new insoles reports that insurance in the past has covered them.  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 07/07/2019   Bilateral carotid artery stenosis 07/07/2019   Gait disturbance 07/07/2019   History of CEA (carotid endarterectomy) 07/07/2019   Hx-TIA (transient ischemic attack) 07/07/2019   Stage 3b chronic kidney disease (HCC) 07/07/2019   Statin intolerance 07/07/2019   Chronic bilateral low back pain without sciatica 08/14/2016   Iron deficiency anemia 06/25/2016   Advance directive on file 04/21/2016   Macular degeneration 02/29/2016   Left tibialis posterior tendinitis 12/23/2014   Essential hypertension 08/12/2014   Menopausal symptoms 02/04/2014   Hyperlipidemia, unspecified 01/06/2012   Osteopenia 01/06/2012   Renal insufficiency 01/06/2012   Acquired hypothyroidism 12/10/2011   Current Outpatient Medications on File Prior to Visit  Medication Sig Dispense Refill   acetaminophen (TYLENOL) 500 MG tablet Take 1,000 mg by mouth at bedtime.     amLODipine (NORVASC) 10 MG tablet Take 10 mg by mouth daily.     ascorbic acid (VITAMIN C) 500 MG tablet Take 0.5 tablets (250 mg total) by mouth daily. 30 tablet 0   aspirin 81 MG EC tablet Take 1 tablet (81 mg total) by mouth daily. Swallow whole. 30 tablet 0   BIOTIN PO Take 1 tablet by  mouth daily with lunch.     busPIRone (BUSPAR) 5 MG tablet Take 5 mg by mouth 2 (two) times daily.     Calcium Carbonate-Vitamin D (CALCIUM-D PO) Take 1 tablet by mouth See admin instructions. 1 tablet by mouth daily with lunch and at bedtime     carvedilol (COREG) 3.125 MG tablet Take 1 tablet (3.125 mg total) by mouth 2 (two) times daily with a meal. 60 tablet 0   clopidogrel (PLAVIX) 75 MG tablet Take 75 mg by mouth at bedtime.     diphenhydramine-acetaminophen (TYLENOL PM) 25-500 MG TABS tablet Take 1 tablet by mouth at bedtime as needed (sleep).     estradiol (ESTRACE) 2 MG tablet Take 2 mg by mouth every morning.     ezetimibe (ZETIA) 10 MG tablet Take 1 tablet (10 mg total) by mouth daily. 30 tablet 0   ferrous sulfate 325 (65 FE) MG tablet Take 325 mg by mouth daily with lunch.     gabapentin (NEURONTIN) 600 MG tablet Take 300 mg by mouth at bedtime.     hydrALAZINE (APRESOLINE) 10 MG tablet Take 0.5 tablets (5 mg total) by mouth every 8 (eight) hours. 90 tablet 0   hydrocortisone 2.5 % cream Apply 1 application topically 2 (two) times daily as needed (itching/rash).     irbesartan (AVAPRO) 300 MG tablet Take 1 tablet (300 mg total) by mouth daily. 30 tablet 0   isosorbide mononitrate (IMDUR) 60 MG 24 hr tablet Take 1 tablet (60 mg total) by mouth daily. 30 tablet 0  levothyroxine (SYNTHROID) 25 MCG tablet Take 25 mcg by mouth daily before breakfast.     Multiple Vitamins-Minerals (PRESERVISION AREDS 2) CAPS Take 1 capsule by mouth every morning.     Omega-3 Fatty Acids (FISH OIL) 1000 MG CAPS Take 1,000 mg by mouth at bedtime.     pantoprazole (PROTONIX) 40 MG tablet Take 40 mg by mouth every morning.     polyethylene glycol powder (GLYCOLAX/MIRALAX) 17 GM/SCOOP powder Take 17 g by mouth daily as needed for mild constipation. 510 g 0   SSD 1 % cream Apply 1 application topically daily. Apply to middle to on right foot     traMADol (ULTRAM) 50 MG tablet Take 1 tablet (50 mg total) by  mouth every 12 (twelve) hours. 7 tablet 0   triamcinolone (KENALOG) 0.1 % Apply 1 application topically 2 (two) times daily as needed (itching/rash).     vitamin B-12 (CYANOCOBALAMIN) 1000 MCG tablet Take 0.5 tablets (500 mcg total) by mouth daily. 30 tablet 0   No current facility-administered medications on file prior to visit.   Allergies  Allergen Reactions   Statins Other (See Comments)    musculoskeletal aches and pains, can't take      Objective: There were no vitals filed for this visit.  General: Patient is awake, alert, oriented x 3 and in no acute distress.  Dermatology: Skin is warm and dry bilateral with a prematurely healed ulceration at right third toe with mild reactive keratosis to the tip of the toe.  There is no malodor, no active drainage, resolved blanchable erythema to the right third toe, resolved localized edema. No other acute signs of infection.  Nails x10 are thickened and elongated consistent with onychomycosis.   Vascular: Dorsalis Pedis pulse = 1/4 Bilateral,  Posterior Tibial pulse = 0/4 Bilateral,  Capillary Fill Time < 5 seconds, 1+ pitting edema bilateral ankles.  Neurologic: Protective sensation present via light touch bilateral.  Musculosketal: Minimal pain to palpation to the right third toe.  Hammertoe gross bony deformities noted bilateral.  Pes planus foot type noted bilateral.  Fat pad atrophy noted bilateral.  Recent Labs    11/28/20 1619  LABORGA ESCHERICHIA COLI*    Assessment and Plan:  Problem List Items Addressed This Visit   None Visit Diagnoses     Toe ulcer, right, limited to breakdown of skin (HCC)    -  Primary   Hammer toes of both feet       Plantar fat pad atrophy       Pain due to onychomycosis of toenails of both feet       PVD (peripheral vascular disease) (HCC)            -Examined patient  -Right third toe ulcer is prematurely healed advised patient to continue with close monitoring and to dry well in  between toes to prevent ulcerations from returning -Recommend shoes that do not rub toes -Patient was casted today for custom functional foot orthotics she will check with her insurance first prior to [place] billing and sending these orthotic moles out to let us know she wants to proceed with getting them -Mechanically debrided nails x10 using a sterile nail nipper without incident -Patient to return to office in 3 months for nail care or sooner for orthotic dispensing if she decides she wants them. Asencion Islam, DPM

## 2021-01-03 ENCOUNTER — Ambulatory Visit (INDEPENDENT_AMBULATORY_CARE_PROVIDER_SITE_OTHER): Payer: Medicare Other | Admitting: Diagnostic Neuroimaging

## 2021-01-03 ENCOUNTER — Encounter: Payer: Self-pay | Admitting: Diagnostic Neuroimaging

## 2021-01-03 VITALS — BP 140/64 | HR 61 | Ht 59.0 in | Wt 158.5 lb

## 2021-01-03 DIAGNOSIS — I161 Hypertensive emergency: Secondary | ICD-10-CM | POA: Diagnosis not present

## 2021-01-03 NOTE — Progress Notes (Signed)
[facility]  PATIENT: Chelsea Moss DOB: 03/15/1927  REFERRING CLINICIAN: Bhagat, Karmen Bongo, MD HISTORY FROM: patient and daughter REASON FOR VISIT: new consult   HISTORICAL  CHIEF COMPLAINT:  Chief Complaint  Patient presents with   New Patient (Initial Visit)    Rm 7 with daughter- Here to f/u from 11/19/20 ED visit. Reports since her Hsp f/u. Reports some swelling in her legs has been noted has been speaking with her PCP about this. She is working with physical therapy on gait strengthening.     HISTORY OF PRESENT ILLNESS:   87 year old female here for evaluation of metabolic encephalopathy, hypertensive urgency, TIA.  Patient has history of right carotid endarterectomy CHF, chronic kidney disease, hypertension, hypercholesterolemia.  Patient present to hospital on 11/19/2020 for confusion and symptoms of congestive heart failure exacerbation.  She was also diagnosed with hypertensive urgency with blood pressure of 198/65.  She was doing well in her hospital course until 11/26/2020 when all of a sudden she was staring and not responding.  During the event her blood pressure was noted to be somewhat low at 106/42.  Patient was found to have significant intracranial stenosis and this event was felt to be a hypoperfusion brain event.  MRI showed no acute findings.  EEG was negative for epileptiform discharges.  Hospital work-up was completed.  Patient is back to baseline.    REVIEW OF SYSTEMS: Full 14 system review of systems performed and negative with exception of: As per HPI.  ALLERGIES: Allergies  Allergen Reactions   Statins Other (See Comments)    musculoskeletal aches and pains, can't take    HOME MEDICATIONS: Outpatient Medications Prior to Visit  Medication Sig Dispense Refill   acetaminophen (TYLENOL) 500 MG tablet Take 1,000 mg by mouth at bedtime.     amLODipine (NORVASC) 10 MG tablet Take 10 mg by mouth daily.     ascorbic acid (VITAMIN C) 500  MG tablet Take 0.5 tablets (250 mg total) by mouth daily. 30 tablet 0   aspirin 81 MG EC tablet Take 1 tablet (81 mg total) by mouth daily. Swallow whole. 30 tablet 0   BIOTIN PO Take 1 tablet by mouth daily with lunch.     busPIRone (BUSPAR) 5 MG tablet Take 5 mg by mouth 2 (two) times daily.     Calcium Carbonate-Vitamin D (CALCIUM-D PO) Take 1 tablet by mouth See admin instructions. 1 tablet by mouth daily with lunch and at bedtime     carvedilol (COREG) 3.125 MG tablet Take 1 tablet (3.125 mg total) by mouth 2 (two) times daily with a meal. 60 tablet 0   clopidogrel (PLAVIX) 75 MG tablet Take 75 mg by mouth at bedtime.     diphenhydramine-acetaminophen (TYLENOL PM) 25-500 MG TABS tablet Take 1 tablet by mouth at bedtime as needed (sleep).     estradiol (ESTRACE) 2 MG tablet Take 2 mg by mouth every morning.     ezetimibe (ZETIA) 10 MG tablet Take 1 tablet (10 mg total) by mouth daily. 30 tablet 0   ferrous sulfate 325 (65 FE) MG tablet Take 325 mg by mouth daily with lunch.     gabapentin (NEURONTIN) 600 MG tablet Take 300 mg by mouth at bedtime.     hydrALAZINE (APRESOLINE) 10 MG tablet Take 0.5 tablets (5 mg total) by mouth every 8 (eight) hours. 90 tablet 0   hydrocortisone 2.5 % cream Apply 1 application topically 2 (two) times daily as needed (itching/rash).  irbesartan (AVAPRO) 300 MG tablet Take 1 tablet (300 mg total) by mouth daily. 30 tablet 0   isosorbide mononitrate (IMDUR) 60 MG 24 hr tablet Take 1 tablet (60 mg total) by mouth daily. 30 tablet 0   levothyroxine (SYNTHROID) 25 MCG tablet Take 25 mcg by mouth daily before breakfast.     Multiple Vitamins-Minerals (PRESERVISION AREDS 2) CAPS Take 1 capsule by mouth every morning.     Omega-3 Fatty Acids (FISH OIL) 1000 MG CAPS Take 1,000 mg by mouth at bedtime.     pantoprazole (PROTONIX) 40 MG tablet Take 40 mg by mouth every morning.     polyethylene glycol powder (GLYCOLAX/MIRALAX) 17 GM/SCOOP powder Take 17 g by mouth daily  as needed for mild constipation. 510 g 0   SSD 1 % cream Apply 1 application topically daily. Apply to middle to on right foot     traMADol (ULTRAM) 50 MG tablet Take 1 tablet (50 mg total) by mouth every 12 (twelve) hours. 7 tablet 0   triamcinolone (KENALOG) 0.1 % Apply 1 application topically 2 (two) times daily as needed (itching/rash).     vitamin B-12 (CYANOCOBALAMIN) 1000 MCG tablet Take 0.5 tablets (500 mcg total) by mouth daily. 30 tablet 0   No facility-administered medications prior to visit.    PAST MEDICAL HISTORY: Past Medical History:  Diagnosis Date   Anemia    Carotid artery stenosis    CHF (congestive heart failure) (HCC)    CKD (chronic kidney disease)    Diastolic heart failure with preserved ejection fraction (HCC)    Gait disturbance    High blood pressure    High cholesterol    History of CEA (carotid endarterectomy)    Hypothyroidism    Iron deficiency    Lacunar infarction (HCC)    Low back pain without sciatica    Muscular degeneration    Osteopenia    Skin lesion    Statin intolerance    Stroke (cerebrum) (HCC)    Tibialis posterior tendinitis     PAST SURGICAL HISTORY: Past Surgical History:  Procedure Laterality Date   ABDOMINAL HYSTERECTOMY     BACK SURGERY     CARPAL TUNNEL RELEASE     CATARACT EXTRACTION     EXTERNAL EAR SURGERY     KIDNEY STONE SURGERY     LUMBAR SPINE SURGERY      FAMILY HISTORY: Family History  Problem Relation Age of Onset   Heart disease Mother    Emphysema Father     SOCIAL HISTORY: Social History   Socioeconomic History   Marital status: Widowed    Spouse name: Not on file   Number of children: Not on file   Years of education: Not on file   Highest education level: Not on file  Occupational History   Not on file  Tobacco Use   Smoking status: Former    Years: 15.00    Types: Cigarettes    Quit date: 06/01/1994    Years since quitting: 26.6   Smokeless tobacco: Never  Substance and Sexual  Activity   Alcohol use: Yes   Drug use: Never   Sexual activity: Not on file  Other Topics Concern   Not on file  Social History Narrative   [facility] independent living center [employer] [facility] [place]     Right handed   Caffeine- 1 cup per day   Social Determinants of Health   Financial Resource Strain: Not on file  Food Insecurity: Not  on file  Transportation Needs: Not on file  Physical Activity: Not on file  Stress: Not on file  Social Connections: Not on file  Intimate Partner Violence: Not on file     PHYSICAL EXAM  GENERAL EXAM/CONSTITUTIONAL: Vitals:  Vitals:   01/03/21 1356  BP: 140/64  Pulse: 61  SpO2: 96%  Weight: 158 lb 8 oz (71.9 kg)  Height: 4\' 11"  (1.499 m)   Body mass index is 32.01 kg/m. Wt Readings from Last 3 Encounters:  01/03/21 158 lb 8 oz (71.9 kg)  11/29/20 152 lb 12.5 oz (69.3 kg)  06/03/20 157 lb (71.2 kg)   Patient is in no distress; well developed, nourished and groomed; neck is supple  CARDIOVASCULAR: Examination of carotid arteries is normal; no carotid bruits Regular rate and rhythm, no murmurs Examination of peripheral vascular system by observation and palpation is normal  EYES: Ophthalmoscopic exam of optic discs and posterior segments is normal; no papilledema or hemorrhages No results found.  MUSCULOSKELETAL: Gait, strength, tone, movements noted in Neurologic exam below  NEUROLOGIC: MENTAL STATUS:  No flowsheet data found. awake, alert, oriented to person, place and time recent and remote memory intact normal attention and concentration language fluent, comprehension intact, naming intact fund of knowledge appropriate  CRANIAL NERVE:  2nd - no papilledema on fundoscopic exam 2nd, 3rd, 4th, 6th - pupils equal and reactive to light, visual fields full to confrontation, extraocular muscles intact, no nystagmus 5th - facial sensation symmetric 7th - facial strength symmetric 8th - hearing intact 9th - palate  elevates symmetrically, uvula midline 11th - shoulder shrug symmetric 12th - tongue protrusion midline  MOTOR:  normal bulk and tone, full strength in the BUE, BLE  SENSORY:  normal and symmetric to light touch  COORDINATION:  finger-nose-finger, fine finger movements normal  REFLEXES:  deep tendon reflexes present and symmetric  GAIT/STATION:  narrow based gait; CAUTIOUS GAIT     DIAGNOSTIC DATA (LABS, IMAGING, TESTING) - I reviewed patient records, labs, notes, testing and imaging myself where available.  Lab Results  Component Value Date   WBC 9.8 11/29/2020   HGB 8.5 (L) 11/29/2020   HCT 25.7 (L) 11/29/2020   MCV 98.5 11/29/2020   PLT 367 11/29/2020      Component Value Date/Time   NA 132 (L) 11/27/2020 0302   K 4.1 11/27/2020 0302   CL 97 (L) 11/27/2020 0302   CO2 28 11/27/2020 0302   GLUCOSE 100 (H) 11/27/2020 0302   BUN 24 (H) 11/27/2020 0302   CREATININE 1.19 (H) 11/27/2020 0302   CALCIUM 9.2 11/27/2020 0302   PROT 5.6 (L) 11/27/2020 0302   ALBUMIN 2.7 (L) 11/27/2020 0302   AST 18 11/27/2020 0302   ALT 16 11/27/2020 0302   ALKPHOS 56 11/27/2020 0302   BILITOT 0.8 11/27/2020 0302   GFRNONAA 43 (L) 11/27/2020 0302   Lab Results  Component Value Date   CHOL 244 (H) 11/27/2020   HDL 101 11/27/2020   LDLCALC 130 (H) 11/27/2020   TRIG 66 11/27/2020   CHOLHDL 2.4 11/27/2020   Lab Results  Component Value Date   HGBA1C 5.7 (H) 11/27/2020   Lab Results  Component Value Date   VITAMINB12 188 11/27/2020   Lab Results  Component Value Date   TSH 3.590 11/19/2020    11/26/20 MRI brain (with and without)  1. No acute infarct. 2. Thin (approximately 2 mm thick) right cerebral convexity subdural collection, which was likely present on CT from November 19, 2020. This  likely represents a small chronic subdural hemorrhage. No significant mass effect. 3. Small remote right thalamic lacunar infarct and mild chronic microvascular ischemic disease.  11/26/20  MRA head (without)  1. Negative intracranial MRA for large vessel occlusion. 2. 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 3. Severe proximal right P2 stenosis, with additional bilateral severe distal P3 stenoses versus occlusions as above. 4. Short-segment mild to moderate proximal right M1 stenosis. 5. 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  7/10 EEG: - no seizure or seizure predisposition recorded on this study.  11/26/20 carotid u/s Right Carotid: Velocities in the right ICA are consistent with a 1-39%  stenosis.   Left Carotid: Velocities in the left ICA are consistent with a 1-39%  stenosis.   Vertebrals:  Bilateral vertebral arteries demonstrate antegrade flow.  Subclavians: Normal flow hemodynamics were seen in bilateral subclavian               arteries.    ASSESSMENT AND PLAN  87 y.o. year old female here with:   Dx:  1. Hypertensive emergency     PLAN:  CONFUSION / AMS (intracranial stenosis and hypotension; initally hypertensive urgency; B12 deficiency) - aspirin + plavix x 3 months (July - Sept 2022), then plavix alone - SBP goal 120-160 (avoid hypotension) - follow up B12 level with PCP  Return for pending if symptoms worsen or fail to improve, return to PCP.    Suanne Marker, MD 01/03/2021, 2:09 PM Certified in Neurology, Neurophysiology and Neuroimaging  [facility] [address], [address] [place], [place] [address] [phone]

## 2021-01-13 ENCOUNTER — Telehealth: Payer: Self-pay | Admitting: Sports Medicine

## 2021-01-13 ENCOUNTER — Other Ambulatory Visit: Payer: Self-pay | Admitting: Sports Medicine

## 2021-01-13 MED ORDER — CEPHALEXIN 500 MG PO CAPS: 500.0000 mg | ORAL_CAPSULE | Freq: Three times a day (TID) | ORAL | 0 refills | Status: DC

## 2021-01-13 NOTE — Telephone Encounter (Signed)
Pt's daughter Bjorn Loser notified and understands/reb

## 2021-01-13 NOTE — Telephone Encounter (Signed)
Pt daughter says pt has drainage and blister (pt sending pic to my email & I will forward)  Has f/u appt on 01-24-21. Pls advise   [facility] [address] [place]

## 2021-01-13 NOTE — Progress Notes (Signed)
Reviewed pictures. Sent keflex antibiotic to pharmacy. Daughter should put Betadine to the toe and cover with bandaid during the day but leave open to air at night. If worsens call office back

## 2021-01-24 ENCOUNTER — Encounter: Payer: Self-pay | Admitting: Sports Medicine

## 2021-01-24 ENCOUNTER — Ambulatory Visit (INDEPENDENT_AMBULATORY_CARE_PROVIDER_SITE_OTHER): Payer: Medicare Other | Admitting: Sports Medicine

## 2021-01-24 DIAGNOSIS — L909 Atrophic disorder of skin, unspecified: Secondary | ICD-10-CM

## 2021-01-24 DIAGNOSIS — M2041 Other hammer toe(s) (acquired), right foot: Secondary | ICD-10-CM

## 2021-01-24 DIAGNOSIS — M2042 Other hammer toe(s) (acquired), left foot: Secondary | ICD-10-CM

## 2021-01-24 DIAGNOSIS — L97511 Non-pressure chronic ulcer of other part of right foot limited to breakdown of skin: Secondary | ICD-10-CM | POA: Diagnosis not present

## 2021-01-24 NOTE — Progress Notes (Signed)
Subjective: Chelsea Moss is a 87 y.o. female patient seen in office for follow-up evaluation of ulceration of the right third toe and for dispensing of orthotics.  Patient is assisted by daughter who reports that toes doing much better and has 2 more doses of antibiotics left.  Denies constitutional symptoms.  Admits a little bit of soreness of the toe but otherwise is doing well and is satisfied with new orthotics.  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 07/07/2019   Bilateral carotid artery stenosis 07/07/2019   Gait disturbance 07/07/2019   History of CEA (carotid endarterectomy) 07/07/2019   Hx-TIA (transient ischemic attack) 07/07/2019   Stage 3b chronic kidney disease (HCC) 07/07/2019   Statin intolerance 07/07/2019   Chronic bilateral low back pain without sciatica 08/14/2016   Iron deficiency anemia 06/25/2016   Advance directive on file 04/21/2016   Macular degeneration 02/29/2016   Left tibialis posterior tendinitis 12/23/2014   Essential hypertension 08/12/2014   Menopausal symptoms 02/04/2014   Hyperlipidemia, unspecified 01/06/2012   Osteopenia 01/06/2012   Renal insufficiency 01/06/2012   Acquired hypothyroidism 12/10/2011   Current Outpatient Medications on File Prior to Visit  Medication Sig Dispense Refill   acetaminophen (TYLENOL) 500 MG tablet Take 1,000 mg by mouth at bedtime.     amLODipine (NORVASC) 10 MG tablet Take 10 mg by mouth daily.     ascorbic acid (VITAMIN C) 500 MG tablet Take 0.5 tablets (250 mg total) by mouth daily. 30 tablet 0   aspirin 81 MG EC tablet Take 1 tablet (81 mg total) by mouth daily. Swallow whole. 30 tablet 0   BIOTIN PO Take 1 tablet by mouth daily with lunch.      busPIRone (BUSPAR) 5 MG tablet Take 5 mg by mouth 2 (two) times daily.     Calcium Carbonate-Vitamin D (CALCIUM-D PO) Take 1 tablet by mouth See admin instructions. 1 tablet by mouth daily with lunch and at bedtime     carvedilol (COREG) 3.125 MG tablet Take 1 tablet (3.125 mg total) by mouth 2 (two) times daily with a meal. 60 tablet 0   cephALEXin (KEFLEX) 500 MG capsule Take 1 capsule (500 mg total) by mouth 3 (three) times daily. 30 capsule 0   clopidogrel (PLAVIX) 75 MG tablet Take 75 mg by mouth at bedtime.     diphenhydramine-acetaminophen (TYLENOL PM) 25-500 MG TABS tablet Take 1 tablet by mouth at bedtime as needed (sleep).     estradiol (ESTRACE) 2 MG tablet Take 2 mg by mouth every morning.     ezetimibe (ZETIA) 10 MG tablet Take 1 tablet (10 mg total) by mouth daily. 30 tablet 0   ferrous sulfate 325 (65 FE) MG tablet Take 325 mg by mouth daily with lunch.     gabapentin (NEURONTIN) 600 MG tablet Take 300 mg by mouth at bedtime.     hydrALAZINE (APRESOLINE) 10 MG tablet Take 0.5 tablets (5 mg total) by mouth every 8 (eight) hours. 90 tablet 0   hydrocortisone 2.5 % cream Apply 1 application topically 2 (two) times daily as needed (itching/rash).     irbesartan (AVAPRO) 300 MG tablet Take 1 tablet (300 mg total) by mouth daily. 30 tablet 0   isosorbide mononitrate (IMDUR) 60 MG  24 hr tablet Take 1 tablet (60 mg total) by mouth daily. 30 tablet 0   levothyroxine (SYNTHROID) 25 MCG tablet Take 25 mcg by mouth daily before breakfast.     Multiple Vitamins-Minerals (PRESERVISION AREDS 2) CAPS Take 1 capsule by mouth every morning.     Omega-3 Fatty Acids (FISH OIL) 1000 MG CAPS Take 1,000 mg by mouth at bedtime.     pantoprazole (PROTONIX) 40 MG tablet Take 40 mg by mouth every morning.     polyethylene glycol powder (GLYCOLAX/MIRALAX) 17 GM/SCOOP powder Take 17 g by mouth daily as needed for mild constipation. 510 g 0   SSD 1 % cream Apply 1 application topically daily. Apply to middle  to on right foot     traMADol (ULTRAM) 50 MG tablet Take 1 tablet (50 mg total) by mouth every 12 (twelve) hours. 7 tablet 0   triamcinolone (KENALOG) 0.1 % Apply 1 application topically 2 (two) times daily as needed (itching/rash).     vitamin B-12 (CYANOCOBALAMIN) 1000 MCG tablet Take 0.5 tablets (500 mcg total) by mouth daily. 30 tablet 0   No current facility-administered medications on file prior to visit.   Allergies  Allergen Reactions   Statins Other (See Comments)    musculoskeletal aches and pains, can't take      Objective: There were no vitals filed for this visit.  General: Patient is awake, alert, oriented x 3 and in no acute distress.  Dermatology: Skin is warm and dry bilateral with a prematurely healed ulceration at right third toe with mild reactive keratosis to the tip of the toe.  There is a pinpoint opening that measures less than 0.1cm, no malodor, no active drainage, resolved blanchable erythema to the right third toe, resolved localized edema. No other acute signs of infection.  Nails x10 are thickened and elongated consistent with onychomycosis.   Vascular: Dorsalis Pedis pulse = 1/4 Bilateral,  Posterior Tibial pulse = 0/4 Bilateral,  Capillary Fill Time < 5 seconds, 1+ pitting edema bilateral ankles.  Neurologic: Protective sensation present via light touch bilateral.  Musculosketal: Minimal pain to palpation to the right third toe.  Hammertoe gross bony deformities noted bilateral.  Pes planus foot type noted bilateral.  Fat pad atrophy noted bilateral.  Recent Labs    11/28/20 1619  LABORGA ESCHERICHIA COLI*    Assessment and Plan:  Problem List Items Addressed This Visit   None Visit Diagnoses     Toe ulcer, right, limited to breakdown of skin (HCC)    -  Primary   Hammer toes of both feet       Plantar fat pad atrophy            -Examined patient  -Right third toe ulcer has a pinpoint opening advised daughter to continue with Betadine  and Band-Aid dressing to the toe may leave open to air at night -Recommend shoes that do not rub toes and may continue with custom functional foot orthotics as dispensed this visit -Patient to return to office in 3 weeks for wound check or sooner if problems or issues arise  Asencion Islam, DPM

## 2021-01-24 NOTE — Progress Notes (Signed)
Patient presents for orthotic pick up.  Verbal and written break in and wear instructions given.  Patient will follow up in 4 weeks with Dr Stover if symptoms worsen or fail to improve.  

## 2021-02-09 ENCOUNTER — Ambulatory Visit: Payer: Medicare Other | Admitting: Cardiovascular Disease

## 2021-02-14 ENCOUNTER — Other Ambulatory Visit: Payer: Self-pay

## 2021-02-14 ENCOUNTER — Ambulatory Visit (INDEPENDENT_AMBULATORY_CARE_PROVIDER_SITE_OTHER): Payer: Medicare Other | Admitting: Sports Medicine

## 2021-02-14 ENCOUNTER — Encounter: Payer: Self-pay | Admitting: Sports Medicine

## 2021-02-14 DIAGNOSIS — L909 Atrophic disorder of skin, unspecified: Secondary | ICD-10-CM | POA: Diagnosis not present

## 2021-02-14 DIAGNOSIS — M2041 Other hammer toe(s) (acquired), right foot: Secondary | ICD-10-CM

## 2021-02-14 DIAGNOSIS — M2042 Other hammer toe(s) (acquired), left foot: Secondary | ICD-10-CM | POA: Diagnosis not present

## 2021-02-14 DIAGNOSIS — L97511 Non-pressure chronic ulcer of other part of right foot limited to breakdown of skin: Secondary | ICD-10-CM | POA: Diagnosis not present

## 2021-02-14 NOTE — Progress Notes (Signed)
Subjective: Chelsea Moss is a 87 y.o. female patient seen in office for follow-up evaluation of ulceration of the right third toe and for orthotic check and reports that othortcs are helping. No other issues noted.   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 07/07/2019   Bilateral carotid artery stenosis 07/07/2019   Gait disturbance 07/07/2019   History of CEA (carotid endarterectomy) 07/07/2019   Hx-TIA (transient ischemic attack) 07/07/2019   Stage 3b chronic kidney disease (HCC) 07/07/2019   Statin intolerance 07/07/2019   Chronic bilateral low back pain without sciatica 08/14/2016   Iron deficiency anemia 06/25/2016   Advance directive on file 04/21/2016   Macular degeneration 02/29/2016   Left tibialis posterior tendinitis 12/23/2014   Essential hypertension 08/12/2014   Menopausal symptoms 02/04/2014   Hyperlipidemia, unspecified 01/06/2012   Osteopenia 01/06/2012   Renal insufficiency 01/06/2012   Acquired hypothyroidism 12/10/2011   Current Outpatient Medications on File Prior to Visit  Medication Sig Dispense Refill   acetaminophen (TYLENOL) 500 MG tablet Take 1,000 mg by mouth at bedtime.     amLODipine (NORVASC) 10 MG tablet Take 10 mg by mouth daily.     ascorbic acid (VITAMIN C) 500 MG tablet Take 0.5 tablets (250 mg total) by mouth daily. 30 tablet 0   aspirin 81 MG EC tablet Take 1 tablet (81 mg total) by mouth daily. Swallow whole. 30 tablet 0   BIOTIN PO Take 1 tablet by mouth daily with lunch.     busPIRone (BUSPAR) 5 MG tablet Take 5 mg by mouth 2 (two) times daily.     Calcium Carbonate-Vitamin D (CALCIUM-D PO) Take 1 tablet by mouth See admin instructions. 1 tablet by mouth daily with lunch  and at bedtime     carvedilol (COREG) 3.125 MG tablet Take 1 tablet (3.125 mg total) by mouth 2 (two) times daily with a meal. 60 tablet 0   cephALEXin (KEFLEX) 500 MG capsule Take 1 capsule (500 mg total) by mouth 3 (three) times daily. 30 capsule 0   clopidogrel (PLAVIX) 75 MG tablet Take 75 mg by mouth at bedtime.     diphenhydramine-acetaminophen (TYLENOL PM) 25-500 MG TABS tablet Take 1 tablet by mouth at bedtime as needed (sleep).     estradiol (ESTRACE) 2 MG tablet Take 2 mg by mouth every morning.     ezetimibe (ZETIA) 10 MG tablet Take 1 tablet (10 mg total) by mouth daily. 30 tablet 0   ferrous sulfate 325 (65 FE) MG tablet Take 325 mg by mouth daily with lunch.     gabapentin (NEURONTIN) 600 MG tablet Take 300 mg by mouth at bedtime.     hydrALAZINE (APRESOLINE) 10 MG tablet Take 0.5 tablets (5 mg total) by mouth every 8 (eight) hours. 90 tablet 0   hydrocortisone 2.5 % cream Apply 1 application topically 2 (two) times daily as needed (itching/rash).     irbesartan (AVAPRO) 300 MG tablet Take 1 tablet (300 mg total) by mouth daily. 30 tablet 0   isosorbide mononitrate (IMDUR) 60 MG 24 hr tablet Take 1 tablet (60 mg total) by mouth daily. 30 tablet 0   levothyroxine (SYNTHROID) 25 MCG tablet Take 25 mcg by mouth daily before breakfast.     Multiple Vitamins-Minerals (  PRESERVISION AREDS 2) CAPS Take 1 capsule by mouth every morning.     Omega-3 Fatty Acids (FISH OIL) 1000 MG CAPS Take 1,000 mg by mouth at bedtime.     pantoprazole (PROTONIX) 40 MG tablet Take 40 mg by mouth every morning.     polyethylene glycol powder (GLYCOLAX/MIRALAX) 17 GM/SCOOP powder Take 17 g by mouth daily as needed for mild constipation. 510 g 0   SSD 1 % cream Apply 1 application topically daily. Apply to middle to on right foot     traMADol (ULTRAM) 50 MG tablet Take 1 tablet (50 mg total) by mouth every 12 (twelve) hours. 7 tablet 0   triamcinolone (KENALOG) 0.1 % Apply 1 application topically 2 (two) times  daily as needed (itching/rash).     vitamin B-12 (CYANOCOBALAMIN) 1000 MCG tablet Take 0.5 tablets (500 mcg total) by mouth daily. 30 tablet 0   No current facility-administered medications on file prior to visit.   Allergies  Allergen Reactions   Statins Other (See Comments)    musculoskeletal aches and pains, can't take      Objective: There were no vitals filed for this visit.  General: Patient is awake, alert, oriented x 3 and in no acute distress.  Dermatology: Skin is warm and dry bilateral with a pinpoint opening that measures less than 0.1cm at right 3rd toe, no malodor, no active drainage, resolved blanchable erythema to the right third toe, resolved localized edema. No other acute signs of infection.  Nails x10 are thickened and elongated consistent with onychomycosis.   Vascular: Dorsalis Pedis pulse = 1/4 Bilateral,  Posterior Tibial pulse = 0/4 Bilateral,  Capillary Fill Time < 5 seconds, 1+ pitting edema bilateral ankles.  Neurologic: Protective sensation present via light touch bilateral.  Musculosketal: Minimal pain to palpation to the right third toe.  Hammertoe gross bony deformities noted bilateral.  Pes planus foot type noted bilateral.  Fat pad atrophy noted bilateral.  Recent Labs    11/28/20 1619  LABORGA ESCHERICHIA COLI*    Assessment and Plan:  Problem List Items Addressed This Visit   None Visit Diagnoses     Toe ulcer, right, limited to breakdown of skin (HCC)    -  Primary   Hammer toes of both feet       Plantar fat pad atrophy          -Examined patient  -Orthotic check performed, added offloading padding to right insole -Right third toe ulcer has a pinpoint opening advised patient may leave open to air as long as there is no drainage or signs of infection -Recommend shoes that do not rub toes and may continue with custom functional foot orthotics as tolerated in her shoes as above -Patient to return to office in 3-4 weeks for wound  check or sooner if problems or issues arise  Asencion Islam, DPM

## 2021-02-15 ENCOUNTER — Ambulatory Visit: Payer: Medicare Other | Admitting: Sports Medicine

## 2021-02-22 ENCOUNTER — Encounter (HOSPITAL_COMMUNITY): Payer: Self-pay | Admitting: Emergency Medicine

## 2021-02-22 ENCOUNTER — Other Ambulatory Visit: Payer: Self-pay

## 2021-02-22 ENCOUNTER — Emergency Department (HOSPITAL_COMMUNITY)
Admission: EM | Admit: 2021-02-22 | Discharge: 2021-02-22 | Disposition: A | Discharge status: Home / Self Care | Payer: Medicare Other | Attending: Emergency Medicine | Admitting: Emergency Medicine

## 2021-02-22 DIAGNOSIS — N1832 Chronic kidney disease, stage 3b: Secondary | ICD-10-CM | POA: Insufficient documentation

## 2021-02-22 DIAGNOSIS — Z79899 Other long term (current) drug therapy: Secondary | ICD-10-CM | POA: Insufficient documentation

## 2021-02-22 DIAGNOSIS — Z7902 Long term (current) use of antithrombotics/antiplatelets: Secondary | ICD-10-CM | POA: Diagnosis not present

## 2021-02-22 DIAGNOSIS — R010 Benign and innocent cardiac murmurs: Secondary | ICD-10-CM | POA: Insufficient documentation

## 2021-02-22 DIAGNOSIS — I5030 Unspecified diastolic (congestive) heart failure: Secondary | ICD-10-CM | POA: Insufficient documentation

## 2021-02-22 DIAGNOSIS — E039 Hypothyroidism, unspecified: Secondary | ICD-10-CM | POA: Diagnosis not present

## 2021-02-22 DIAGNOSIS — I130 Hypertensive heart and chronic kidney disease with heart failure and stage 1 through stage 4 chronic kidney disease, or unspecified chronic kidney disease: Secondary | ICD-10-CM | POA: Insufficient documentation

## 2021-02-22 DIAGNOSIS — R55 Syncope and collapse: Secondary | ICD-10-CM | POA: Insufficient documentation

## 2021-02-22 DIAGNOSIS — Z7982 Long term (current) use of aspirin: Secondary | ICD-10-CM | POA: Insufficient documentation

## 2021-02-22 DIAGNOSIS — Z87891 Personal history of nicotine dependence: Secondary | ICD-10-CM | POA: Insufficient documentation

## 2021-02-22 DIAGNOSIS — E860 Dehydration: Secondary | ICD-10-CM | POA: Diagnosis not present

## 2021-02-22 LAB — URINALYSIS, ROUTINE W REFLEX MICROSCOPIC
Bilirubin Urine: NEGATIVE
Glucose, UA: NEGATIVE mg/dL
Hgb urine dipstick: NEGATIVE
Ketones, ur: NEGATIVE mg/dL
Nitrite: NEGATIVE
Protein, ur: NEGATIVE mg/dL
Specific Gravity, Urine: 1.009 (ref 1.005–1.030)
pH: 7 (ref 5.0–8.0)

## 2021-02-22 LAB — CBC WITH DIFFERENTIAL/PLATELET
Abs Immature Granulocytes: 0.07 10*3/uL (ref 0.00–0.07)
Basophils Absolute: 0 10*3/uL (ref 0.0–0.1)
Basophils Relative: 0 %
Eosinophils Absolute: 0.1 10*3/uL (ref 0.0–0.5)
Eosinophils Relative: 1 %
HCT: 36.5 % (ref 36.0–46.0)
Hemoglobin: 12.2 g/dL (ref 12.0–15.0)
Immature Granulocytes: 1 %
Lymphocytes Relative: 8 %
Lymphs Abs: 1.1 10*3/uL (ref 0.7–4.0)
MCH: 32.3 pg (ref 26.0–34.0)
MCHC: 33.4 g/dL (ref 30.0–36.0)
MCV: 96.6 fL (ref 80.0–100.0)
Monocytes Absolute: 1 10*3/uL (ref 0.1–1.0)
Monocytes Relative: 7 %
Neutro Abs: 11.9 10*3/uL — ABNORMAL HIGH (ref 1.7–7.7)
Neutrophils Relative %: 83 %
Platelets: 390 10*3/uL (ref 150–400)
RBC: 3.78 MIL/uL — ABNORMAL LOW (ref 3.87–5.11)
RDW: 13.2 % (ref 11.5–15.5)
WBC: 14.2 10*3/uL — ABNORMAL HIGH (ref 4.0–10.5)
nRBC: 0 % (ref 0.0–0.2)

## 2021-02-22 LAB — COMPREHENSIVE METABOLIC PANEL
ALT: 18 U/L (ref 0–44)
AST: 22 U/L (ref 15–41)
Albumin: 3.6 g/dL (ref 3.5–5.0)
Alkaline Phosphatase: 60 U/L (ref 38–126)
Anion gap: 11 (ref 5–15)
BUN: 28 mg/dL — ABNORMAL HIGH (ref 8–23)
CO2: 25 mmol/L (ref 22–32)
Calcium: 9.6 mg/dL (ref 8.9–10.3)
Chloride: 96 mmol/L — ABNORMAL LOW (ref 98–111)
Creatinine, Ser: 1.49 mg/dL — ABNORMAL HIGH (ref 0.44–1.00)
GFR, Estimated: 33 mL/min — ABNORMAL LOW (ref >60)
Glucose, Bld: 122 mg/dL — ABNORMAL HIGH (ref 70–99)
Potassium: 4.2 mmol/L (ref 3.5–5.1)
Sodium: 132 mmol/L — ABNORMAL LOW (ref 135–145)
Total Bilirubin: 0.8 mg/dL (ref 0.3–1.2)
Total Protein: 6.8 g/dL (ref 6.5–8.1)

## 2021-02-22 LAB — TROPONIN I (HIGH SENSITIVITY)
Troponin I (High Sensitivity): 9 ng/L (ref <18)
Troponin I (High Sensitivity): 10 ng/L (ref <18)

## 2021-02-22 MED ORDER — SODIUM CHLORIDE 0.9 % IV BOLUS
500.0000 mL | Freq: Once | INTRAVENOUS | Status: AC
  Administered 2021-02-22: 500 mL via INTRAVENOUS

## 2021-02-22 NOTE — ED Notes (Signed)
Daughter at bedside.

## 2021-02-22 NOTE — ED Notes (Signed)
Got patient undressed on the monitor did ekg shown to Dr Pickering patient is resting with call bell in reach 

## 2021-02-22 NOTE — ED Provider Notes (Signed)
[facility] EMERGENCY DEPARTMENT Provider Note   CSN: [id number] Arrival date & time: 02/22/21  1140     History Chief Complaint  Patient presents with   Near Syncope    Chelsea Moss is a 87 y.o. female.   Near Syncope Associated symptoms include abdominal pain. Pertinent negatives include no chest pain and no shortness of breath. Patient presents with syncope or near syncope.  Reportedly got up feeling okay.  Had eaten breakfast.  Began to feel lightheaded.  Went to the bathroom with a walker because she was feeling unsteady.  Had called her family.  Found decreased responsive versus unresponsive on the toilet.  Hypotensive for EMS.  Feeling better now.  States she had had some lower abdominal pain at the time but has resolved.  States she gets lightheaded when her sodium drops.  States that she has not been needing extra salt.  No headache.  States she is feeling much better than she did before.     Past Medical History:  Diagnosis Date   Anemia    Carotid artery stenosis    CHF (congestive heart failure) (HCC)    CKD (chronic kidney disease)    Diastolic heart failure with preserved ejection fraction (HCC)    Gait disturbance    High blood pressure    High cholesterol    History of CEA (carotid endarterectomy)    Hypothyroidism    Iron deficiency    Lacunar infarction (HCC)    Low back pain without sciatica    Muscular degeneration    Osteopenia    Skin lesion    Statin intolerance    Stroke (cerebrum) (HCC)    Tibialis posterior tendinitis     Patient Active Problem List   Diagnosis Date Noted   Hypertensive urgency 11/20/2020   Hypertensive emergency 11/19/2020   Chronic heart failure with preserved ejection fraction (HCC) 11/11/2020   Lacunar stroke (HCC) 11/11/2020   Advanced age 41/25/2021   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 07/07/2019   Bilateral carotid artery stenosis 07/07/2019   Gait disturbance 07/07/2019    History of CEA (carotid endarterectomy) 07/07/2019   Hx-TIA (transient ischemic attack) 07/07/2019   Stage 3b chronic kidney disease (HCC) 07/07/2019   Statin intolerance 07/07/2019   Chronic bilateral low back pain without sciatica 08/14/2016   Iron deficiency anemia 06/25/2016   Advance directive on file 04/21/2016   Macular degeneration 02/29/2016   Left tibialis posterior tendinitis 12/23/2014   Essential hypertension 08/12/2014   Menopausal symptoms 02/04/2014   Hyperlipidemia, unspecified 01/06/2012   Osteopenia 01/06/2012   Renal insufficiency 01/06/2012   Acquired hypothyroidism 12/10/2011    Past Surgical History:  Procedure Laterality Date   ABDOMINAL HYSTERECTOMY     BACK SURGERY     CARPAL TUNNEL RELEASE     CATARACT EXTRACTION     EXTERNAL EAR SURGERY     KIDNEY STONE SURGERY     LUMBAR SPINE SURGERY       OB History   No obstetric history on file.     Family History  Problem Relation Age of Onset   Heart disease Mother    Emphysema Father     Social History   Tobacco Use   Smoking status: Former    Years: 15.00    Types: Cigarettes    Quit date: 06/01/1994    Years since quitting: 26.7   Smokeless tobacco: Never  Substance Use Topics   Alcohol use: Yes   Drug  use: Never    Home Medications Prior to Admission medications   Medication Sig Start Date End Date Taking? Authorizing Provider  acetaminophen (TYLENOL) 500 MG tablet Take 1,000 mg by mouth at bedtime.    [provider]  amLODipine (NORVASC) 10 MG tablet Take 10 mg by mouth daily.    [provider]  ascorbic acid (VITAMIN C) 500 MG tablet Take 0.5 tablets (250 mg total) by mouth daily. 11/29/20   Drema Dallas, MD  aspirin 81 MG EC tablet Take 1 tablet (81 mg total) by mouth daily. Swallow whole. 11/30/20   Drema Dallas, MD  baclofen (LIORESAL) 10 MG tablet Take 10 mg by mouth 2 (two) times daily as needed. 01/08/21   [provider]  BIOTIN PO Take 1 tablet  by mouth daily with lunch.    [provider]  busPIRone (BUSPAR) 5 MG tablet Take 5 mg by mouth 2 (two) times daily. 11/14/20   [provider]  Calcium Carbonate-Vitamin D (CALCIUM-D PO) Take 1 tablet by mouth See admin instructions. 1 tablet by mouth daily with lunch and at bedtime    [provider]  carvedilol (COREG) 3.125 MG tablet Take 1 tablet (3.125 mg total) by mouth 2 (two) times daily with a meal. 11/29/20   Drema Dallas, MD  cephALEXin (KEFLEX) 500 MG capsule Take 1 capsule (500 mg total) by mouth 3 (three) times daily. 01/13/21   Asencion Islam, DPM  cloNIDine (CATAPRES) 0.2 MG tablet Take 0.2 mg by mouth 3 (three) times daily. 12/29/20   [provider]  clopidogrel (PLAVIX) 75 MG tablet Take 75 mg by mouth at bedtime. 07/22/19   [provider]  diphenhydramine-acetaminophen (TYLENOL PM) 25-500 MG TABS tablet Take 1 tablet by mouth at bedtime as needed (sleep).    [provider]  estradiol (ESTRACE) 2 MG tablet Take 2 mg by mouth every morning.    [provider]  ezetimibe (ZETIA) 10 MG tablet Take 1 tablet (10 mg total) by mouth daily. 11/30/20   Drema Dallas, MD  ferrous sulfate 325 (65 FE) MG tablet Take 325 mg by mouth daily with lunch.    [provider]  furosemide (LASIX) 20 MG tablet Take 20 mg by mouth daily. 01/08/21   [provider]  gabapentin (NEURONTIN) 600 MG tablet Take 300 mg by mouth at bedtime.    [provider]  hydrALAZINE (APRESOLINE) 10 MG tablet Take 0.5 tablets (5 mg total) by mouth every 8 (eight) hours. 11/29/20   Drema Dallas, MD  hydrocortisone 2.5 % cream Apply 1 application topically 2 (two) times daily as needed (itching/rash).    [provider]  irbesartan (AVAPRO) 300 MG tablet Take 1 tablet (300 mg total) by mouth daily. 11/30/20   Drema Dallas, MD  isosorbide mononitrate (IMDUR) 60 MG 24 hr tablet Take 1 tablet (60 mg total) by mouth daily.  11/30/20   Drema Dallas, MD  levothyroxine (SYNTHROID) 25 MCG tablet Take 25 mcg by mouth daily before breakfast.    [provider]  Multiple Vitamins-Minerals (PRESERVISION AREDS 2) CAPS Take 1 capsule by mouth every morning.    [provider]  Omega-3 Fatty Acids (FISH OIL) 1000 MG CAPS Take 1,000 mg by mouth at bedtime.    [provider]  pantoprazole (PROTONIX) 40 MG tablet Take 40 mg by mouth every morning.    [provider]  polyethylene glycol powder (GLYCOLAX/MIRALAX) 17 GM/SCOOP powder Take  17 g by mouth daily as needed for mild constipation. 11/29/20   Drema Dallas, MD  spironolactone (ALDACTONE) 25 MG tablet Take 25 mg by mouth daily. 01/08/21   [provider]  SSD 1 % cream Apply 1 application topically daily. Apply to middle to on right foot 11/10/20   [provider]  traMADol (ULTRAM) 50 MG tablet Take 1 tablet (50 mg total) by mouth every 12 (twelve) hours. 11/29/20   Drema Dallas, MD  triamcinolone (KENALOG) 0.1 % Apply 1 application topically 2 (two) times daily as needed (itching/rash).    [provider]  vitamin B-12 (CYANOCOBALAMIN) 1000 MCG tablet Take 0.5 tablets (500 mcg total) by mouth daily. 11/29/20   Drema Dallas, MD    Allergies    Statins  Review of Systems   Review of Systems  Constitutional:  Negative for appetite change.  HENT:  Negative for congestion.   Respiratory:  Negative for shortness of breath.   Cardiovascular:  Positive for near-syncope. Negative for chest pain.  Gastrointestinal:  Positive for abdominal pain.  Genitourinary:  Negative for flank pain.  Musculoskeletal:  Negative for back pain.  Neurological:  Positive for syncope and light-headedness.  Psychiatric/Behavioral:  Negative for confusion.    Physical Exam Updated Vital Signs BP (!) 196/64   Pulse 71   Temp 97.8 F (36.6 C) (Oral)   Resp 15   Ht 4\' 11"  (1.499 m)   Wt 65.4 kg   SpO2 96%   BMI 29.12  kg/m   Physical Exam Vitals and nursing note reviewed.  HENT:     Head: Atraumatic.  Eyes:     Extraocular Movements: Extraocular movements intact.  Cardiovascular:     Rate and Rhythm: Rhythm irregular.  Pulmonary:     Breath sounds: No wheezing.  Abdominal:     Tenderness: There is no abdominal tenderness.  Musculoskeletal:        General: No tenderness.     Cervical back: Neck supple.  Skin:    General: Skin is warm.     Capillary Refill: Capillary refill takes less than 2 seconds.  Neurological:     Mental Status: She is alert and oriented to person, place, and time.    ED Results / Procedures / Treatments   Labs (all labs ordered are listed, but only abnormal results are displayed) Labs Reviewed  URINALYSIS, ROUTINE W REFLEX MICROSCOPIC - Abnormal; Notable for the following components:      Result Value   APPearance CLOUDY (*)    Leukocytes,Ua SMALL (*)    Bacteria, UA FEW (*)    All other components within normal limits  COMPREHENSIVE METABOLIC PANEL - Abnormal; Notable for the following components:   Sodium 132 (*)    Chloride 96 (*)    Glucose, Bld 122 (*)    BUN 28 (*)    Creatinine, Ser 1.49 (*)    GFR, Estimated 33 (*)    All other components within normal limits  CBC WITH DIFFERENTIAL/PLATELET - Abnormal; Notable for the following components:   WBC 14.2 (*)    RBC 3.78 (*)    Neutro Abs 11.9 (*)    All other components within normal limits  URINE CULTURE  TROPONIN I (HIGH SENSITIVITY)  TROPONIN I (HIGH SENSITIVITY)    EKG EKG Interpretation  Date/Time:  Wednesday February 22 2021 11:51:56 EDT Ventricular Rate:  64 PR Interval:  189 QRS Duration: 101 QT Interval:  408 QTC Calculation: 421 R Axis:  72 Text Interpretation: Sinus rhythm Anteroseptal infarct, old Borderline T abnormalities, inferior leads Confirmed by Benjiman Core [phone]) on 02/22/2021 12:35:33 PM  Radiology No results found.  Procedures Procedures   Medications  Ordered in ED Medications  sodium chloride 0.9 % bolus 500 mL (500 mLs Intravenous New Bag/Given 02/22/21 1339)    ED Course  I have reviewed the triage vital signs and the nursing notes.  Pertinent labs & imaging results that were available during my care of the patient were reviewed by me and considered in my medical decision making (see chart for details).    MDM Rules/Calculators/A&P                           Patient with syncopal episode.  Appears to be dehydrated.  Creatinine above baseline.  Fluid bolus given.  Patient feels better blood pressure improved.  Patient's daughter later arrived and said that patient's been taking Lasix.  Had as needed orders for Lasix.  Has been taking it however even after the as needed time he had finished.  Reportedly has lost weight below her baseline.  Discussed with patient and sister and they will discuss with cardiology and PCP about further guidelines for the as needed medicines.  Has had extensive work-up for similar symptoms recently.  Do not think she needs to come in the hospital now.  Has had some dysuria at times but does not feel as if she has a UTI right now, however urine cultures been sent for some urine abnormalities.  If culture did come back positive with the patient's abdominal pain and feeling worse I would treat it. Final Clinical Impression(s) / ED Diagnoses Final diagnoses:  Dehydration  Syncope, unspecified syncope type    Rx / DC Orders ED Discharge Orders     None        Benjiman Core, MD 02/22/21 1624

## 2021-02-22 NOTE — ED Triage Notes (Signed)
Pt BIB [facility] from home, called daughter at 65, asked her to come over. At 1030, pt found slumped over on toilet, unresponsive, had a BM. Unable to follow commands. Denies dizziness, denies fall. Hx of same, last time due to hyponatremia. Initial BP 96 palp. Last BP 148/60, CBG 134, HR 60s.

## 2021-02-22 NOTE — Discharge Instructions (Addendum)
You have likely had too much Lasix.  Follow-up with your doctor about further criteria such as potentially a target weight

## 2021-02-25 LAB — URINE CULTURE: Culture: 20000 — AB

## 2021-02-26 ENCOUNTER — Telehealth: Payer: Self-pay | Admitting: Emergency Medicine

## 2021-02-26 NOTE — Telephone Encounter (Signed)
Post ED Visit - Positive Culture Follow-up: Successful Patient Follow-Up  Culture assessed and recommendations reviewed by:  []  , Pharm.D. []  Enzo Bi, Pharm.D., BCPS AQ-ID []  , Pharm.D., BCPS []  Celedonio Miyamoto, Pharm.D., BCPS []  [place], Garvin Fila.D., BCPS, AAHIVP []  , Pharm.D., BCPS, AAHIVP []  Georgina Pillion, PharmD, BCPS []  , PharmD, BCPS []  [place], PharmD, BCPS [x]  [address], PharmD  Positive urine culture  [x]  Patient discharged without antimicrobial prescription and treatment is now indicated []  Organism is resistant to prescribed ED discharge antimicrobial []  Patient with positive blood cultures  Changes discussed with ED provider: PA New antibiotic prescription Cefdinir 300 mg daily for seven days Called to [name] [facility]) [phone]   Contacted patient, date 02/26/21, time 1430 Patient reports dysuria.   Chelsea Moss 02/26/2021, 4:52 PM

## 2021-02-26 NOTE — Progress Notes (Signed)
ED Antimicrobial Stewardship Positive Culture Follow Up   Chelsea Moss is an 87 y.o. female who presented to [facility] on 02/22/2021 with a chief complaint of  Chief Complaint  Patient presents with   Near Syncope    Recent Results (from the past 720 hour(s))  Urine Culture     Status: Abnormal   Collection Time: 02/22/21  1:05 PM   Specimen: Urine, Clean Catch  Result Value Ref Range Status   Specimen Description URINE, CLEAN CATCH  Final   Special Requests   Final    NONE Performed at [facility], [address] [place], [place] [address]    Culture 20,000 COLONIES/mL ESCHERICHIA COLI (A)  Final   Report Status 02/25/2021 FINAL  Final   Organism ID, Bacteria ESCHERICHIA COLI (A)  Final      Susceptibility   Escherichia coli - MIC*    AMPICILLIN 4 SENSITIVE Sensitive     CEFAZOLIN <=4 SENSITIVE Sensitive     CEFEPIME <=0.12 SENSITIVE Sensitive     CEFTRIAXONE <=0.25 SENSITIVE Sensitive     CIPROFLOXACIN <=0.25 SENSITIVE Sensitive     GENTAMICIN <=1 SENSITIVE Sensitive     IMIPENEM <=0.25 SENSITIVE Sensitive     NITROFURANTOIN <=16 SENSITIVE Sensitive     TRIMETH/SULFA <=20 SENSITIVE Sensitive     AMPICILLIN/SULBACTAM <=2 SENSITIVE Sensitive     PIP/TAZO <=4 SENSITIVE Sensitive     * 20,000 COLONIES/mL ESCHERICHIA COLI    [x]  Patient discharged originally without antimicrobial agent and treatment is now indicated  New antibiotic prescription: Cefdinir 300 mg daily x 7 days (Qty 7; Refills 0)  ED Provider: PA 02/26/2021, 11:43 AM Clinical Pharmacist Monday - Friday phone -  [phone] Saturday - Sunday phone - [phone]

## 2021-03-07 ENCOUNTER — Encounter: Payer: Self-pay | Admitting: Sports Medicine

## 2021-03-07 ENCOUNTER — Ambulatory Visit (INDEPENDENT_AMBULATORY_CARE_PROVIDER_SITE_OTHER): Payer: Medicare Other | Admitting: Sports Medicine

## 2021-03-07 ENCOUNTER — Other Ambulatory Visit: Payer: Self-pay

## 2021-03-07 DIAGNOSIS — M2041 Other hammer toe(s) (acquired), right foot: Secondary | ICD-10-CM

## 2021-03-07 DIAGNOSIS — L909 Atrophic disorder of skin, unspecified: Secondary | ICD-10-CM

## 2021-03-07 DIAGNOSIS — I739 Peripheral vascular disease, unspecified: Secondary | ICD-10-CM

## 2021-03-07 DIAGNOSIS — L97511 Non-pressure chronic ulcer of other part of right foot limited to breakdown of skin: Secondary | ICD-10-CM | POA: Diagnosis not present

## 2021-03-07 DIAGNOSIS — M2042 Other hammer toe(s) (acquired), left foot: Secondary | ICD-10-CM

## 2021-03-07 NOTE — Progress Notes (Signed)
Subjective: Chelsea Moss is a 87 y.o. female patient seen in office for follow-up evaluation of ulceration of the right third toe and for orthotic check and reports that othortcs are helping but pad is starting to lift.  No other pedal complaints noted.  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 07/07/2019   Bilateral carotid artery stenosis 07/07/2019   Gait disturbance 07/07/2019   History of CEA (carotid endarterectomy) 07/07/2019   Hx-TIA (transient ischemic attack) 07/07/2019   Stage 3b chronic kidney disease (HCC) 07/07/2019   Statin intolerance 07/07/2019   Chronic bilateral low back pain without sciatica 08/14/2016   Iron deficiency anemia 06/25/2016   Advance directive on file 04/21/2016   Macular degeneration 02/29/2016   Left tibialis posterior tendinitis 12/23/2014   Essential hypertension 08/12/2014   Menopausal symptoms 02/04/2014   Hyperlipidemia, unspecified 01/06/2012   Osteopenia 01/06/2012   Renal insufficiency 01/06/2012   Acquired hypothyroidism 12/10/2011   Current Outpatient Medications on File Prior to Visit  Medication Sig Dispense Refill   acetaminophen (TYLENOL) 500 MG tablet Take 1,000 mg by mouth at bedtime.     amLODipine (NORVASC) 10 MG tablet Take 10 mg by mouth daily.     ascorbic acid (VITAMIN C) 500 MG tablet Take 0.5 tablets (250 mg total) by mouth daily. 30 tablet 0   aspirin 81 MG EC tablet Take 1 tablet (81 mg total) by mouth daily. Swallow whole. 30 tablet 0   baclofen (LIORESAL) 10 MG tablet Take 10 mg by mouth 2 (two) times daily as needed.     BIOTIN PO Take 1 tablet by mouth daily with lunch.     busPIRone (BUSPAR) 5 MG tablet Take 5 mg by mouth 2 (two) times daily.      Calcium Carbonate-Vitamin D (CALCIUM-D PO) Take 1 tablet by mouth See admin instructions. 1 tablet by mouth daily with lunch and at bedtime     carvedilol (COREG) 3.125 MG tablet Take 1 tablet (3.125 mg total) by mouth 2 (two) times daily with a meal. 60 tablet 0   cephALEXin (KEFLEX) 500 MG capsule Take 1 capsule (500 mg total) by mouth 3 (three) times daily. 30 capsule 0   cloNIDine (CATAPRES) 0.2 MG tablet Take 0.2 mg by mouth 3 (three) times daily.     clopidogrel (PLAVIX) 75 MG tablet Take 75 mg by mouth at bedtime.     diphenhydramine-acetaminophen (TYLENOL PM) 25-500 MG TABS tablet Take 1 tablet by mouth at bedtime as needed (sleep).     estradiol (ESTRACE) 2 MG tablet Take 2 mg by mouth every morning.     ezetimibe (ZETIA) 10 MG tablet Take 1 tablet (10 mg total) by mouth daily. 30 tablet 0   ferrous sulfate 325 (65 FE) MG tablet Take 325 mg by mouth daily with lunch.     furosemide (LASIX) 20 MG tablet Take 20 mg by mouth daily.     gabapentin (NEURONTIN) 600 MG tablet Take 300 mg by mouth at bedtime.     hydrALAZINE (APRESOLINE) 10 MG tablet Take 0.5 tablets (5 mg total) by mouth every 8 (eight) hours. 90 tablet 0   hydrocortisone 2.5 % cream Apply 1 application topically 2 (two) times daily as needed (itching/rash).  irbesartan (AVAPRO) 300 MG tablet Take 1 tablet (300 mg total) by mouth daily. 30 tablet 0   isosorbide mononitrate (IMDUR) 60 MG 24 hr tablet Take 1 tablet (60 mg total) by mouth daily. 30 tablet 0   levothyroxine (SYNTHROID) 25 MCG tablet Take 25 mcg by mouth daily before breakfast.     Multiple Vitamins-Minerals (PRESERVISION AREDS 2) CAPS Take 1 capsule by mouth every morning.     Omega-3 Fatty Acids (FISH OIL) 1000 MG CAPS Take 1,000 mg by mouth at bedtime.     pantoprazole (PROTONIX) 40 MG tablet Take 40 mg by mouth every morning.     polyethylene glycol powder (GLYCOLAX/MIRALAX) 17 GM/SCOOP powder Take 17 g by mouth daily as needed for mild constipation. 510 g  0   spironolactone (ALDACTONE) 25 MG tablet Take 25 mg by mouth daily.     SSD 1 % cream Apply 1 application topically daily. Apply to middle to on right foot     traMADol (ULTRAM) 50 MG tablet Take 1 tablet (50 mg total) by mouth every 12 (twelve) hours. 7 tablet 0   triamcinolone (KENALOG) 0.1 % Apply 1 application topically 2 (two) times daily as needed (itching/rash).     vitamin B-12 (CYANOCOBALAMIN) 1000 MCG tablet Take 0.5 tablets (500 mcg total) by mouth daily. 30 tablet 0   No current facility-administered medications on file prior to visit.   Allergies  Allergen Reactions   Statins Other (See Comments)    musculoskeletal aches and pains, can't take      Objective: There were no vitals filed for this visit.  General: Patient is awake, alert, oriented x 3 and in no acute distress.  Dermatology: Skin is warm callused over skin at the right 3rd toe, no malodor, no active drainage, resolved blanchable erythema to the right third toe, resolved localized edema. No other acute signs of infection.  Nails x10 are thickened and short consistent with onychomycosis.   Vascular: Dorsalis Pedis pulse = 1/4 Bilateral,  Posterior Tibial pulse = 0/4 Bilateral,  Capillary Fill Time < 5 seconds, 1+ pitting edema bilateral ankles.  Neurologic: Protective sensation present via light touch bilateral.  Musculosketal: No significant pain to palpation to the right third toe.  Hammertoe gross bony deformities noted bilateral.  Pes planus foot type noted bilateral.  Fat pad atrophy noted bilateral.  Recent Labs    11/28/20 1619 02/22/21 1305  LABORGA ESCHERICHIA COLI* ESCHERICHIA COLI*    Assessment and Plan:  Problem List Items Addressed This Visit   None Visit Diagnoses     Toe ulcer, right, limited to breakdown of skin (HCC)    -  Primary   Hammer toes of both feet       Plantar fat pad atrophy       PVD (peripheral vascular disease) (HCC)            -Examined patient   -Orthotic check performed, added offloading padding to right insole at the undersurface so that way the pad does not keep pulling up/rolling up when she puts her foot inside her shoe -Right third toe ulcer has a dry callus over it that does not appear to be currently symptomatic advised patient and daughter that we will let this callus get a little bit more tougher before I attempt to trim it we will wait to trim her next visit -Patient to return to office in 3-4 weeks for wound check or sooner if problems or issues arise  Asencion Islam, DPM

## 2021-03-23 ENCOUNTER — Telehealth: Payer: Self-pay

## 2021-03-29 ENCOUNTER — Ambulatory Visit (INDEPENDENT_AMBULATORY_CARE_PROVIDER_SITE_OTHER): Payer: Medicare Other | Admitting: Podiatrist

## 2021-03-29 ENCOUNTER — Other Ambulatory Visit: Payer: Self-pay

## 2021-03-29 DIAGNOSIS — L97511 Non-pressure chronic ulcer of other part of right foot limited to breakdown of skin: Secondary | ICD-10-CM

## 2021-03-29 NOTE — Progress Notes (Signed)
Patient presents today for orthotic evaluation.  She relates the orthotics she currently has are folding up when she puts them in her shoes and now have a large crease she is unable to tolerate.  She also has an ulcer at the tip of her right third toe we would like to try and offload.    Will send the orthotics back for a new topcover made of 34mm pcell and 1.5 ppt with an area for offloading the 3rd toe.    Will call when orthotics are back- she sees Dr. Marylene Land in [place] and would like to pick up there if possible.

## 2021-03-31 ENCOUNTER — Other Ambulatory Visit: Payer: Medicare Other

## 2021-04-07 ENCOUNTER — Ambulatory Visit (INDEPENDENT_AMBULATORY_CARE_PROVIDER_SITE_OTHER): Payer: Medicare Other | Admitting: Sports Medicine

## 2021-04-07 DIAGNOSIS — M2041 Other hammer toe(s) (acquired), right foot: Secondary | ICD-10-CM

## 2021-04-07 DIAGNOSIS — L909 Atrophic disorder of skin, unspecified: Secondary | ICD-10-CM

## 2021-04-07 DIAGNOSIS — I739 Peripheral vascular disease, unspecified: Secondary | ICD-10-CM | POA: Diagnosis not present

## 2021-04-07 DIAGNOSIS — M2042 Other hammer toe(s) (acquired), left foot: Secondary | ICD-10-CM

## 2021-04-07 DIAGNOSIS — L97511 Non-pressure chronic ulcer of other part of right foot limited to breakdown of skin: Secondary | ICD-10-CM | POA: Diagnosis not present

## 2021-04-07 NOTE — Progress Notes (Signed)
Subjective: Chelsea Moss is a 87 y.o. female patient seen in office for follow-up evaluation of ulceration of the right third toe and for orthotic check; states that last week she was seen in [place] and will have orthotics adjusted. Denies any other pedal complaints. Requests nail trim as well.   Patient is assisted by daughter this visit.  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 07/07/2019   Bilateral carotid artery stenosis 07/07/2019   Gait disturbance 07/07/2019   History of CEA (carotid endarterectomy) 07/07/2019   Hx-TIA (transient ischemic attack) 07/07/2019   Stage 3b chronic kidney disease ([place]) 07/07/2019   Statin intolerance 07/07/2019   Chronic bilateral low back pain without sciatica 08/14/2016   Iron deficiency anemia 06/25/2016   Advance directive on file 04/21/2016   Macular degeneration 02/29/2016   Left tibialis posterior tendinitis 12/23/2014   Essential hypertension 08/12/2014   Menopausal symptoms 02/04/2014   Hyperlipidemia, unspecified 01/06/2012   Osteopenia 01/06/2012   Renal insufficiency 01/06/2012   Acquired hypothyroidism 12/10/2011   Current Outpatient Medications on File Prior to Visit  Medication Sig Dispense Refill   acetaminophen (TYLENOL) 500 MG tablet Take 1,000 mg by mouth at bedtime.     amLODipine (NORVASC) 10 MG tablet Take 10 mg by mouth daily.     ascorbic acid (VITAMIN C) 500 MG tablet Take 0.5 tablets (250 mg total) by mouth daily. 30 tablet 0   aspirin 81 MG EC tablet Take 1 tablet (81 mg total) by mouth daily. Swallow whole. 30 tablet 0   baclofen (LIORESAL) 10 MG tablet Take 10 mg by mouth 2 (two) times daily as needed.     BIOTIN PO Take 1 tablet by mouth  daily with lunch.     busPIRone (BUSPAR) 5 MG tablet Take 5 mg by mouth 2 (two) times daily.     Calcium Carbonate-Vitamin D (CALCIUM-D PO) Take 1 tablet by mouth See admin instructions. 1 tablet by mouth daily with lunch and at bedtime     carvedilol (COREG) 3.125 MG tablet Take 1 tablet (3.125 mg total) by mouth 2 (two) times daily with a meal. 60 tablet 0   cephALEXin (KEFLEX) 500 MG capsule Take 1 capsule (500 mg total) by mouth 3 (three) times daily. 30 capsule 0   cloNIDine (CATAPRES) 0.2 MG tablet Take 0.2 mg by mouth 3 (three) times daily.     clopidogrel (PLAVIX) 75 MG tablet Take 75 mg by mouth at bedtime.     diphenhydramine-acetaminophen (TYLENOL PM) 25-500 MG TABS tablet Take 1 tablet by mouth at bedtime as needed (sleep).     estradiol (ESTRACE) 2 MG tablet Take 2 mg by mouth every morning.     ezetimibe (ZETIA) 10 MG tablet Take 1 tablet (10 mg total) by mouth daily. 30 tablet 0   ferrous sulfate 325 (65 FE) MG tablet Take 325 mg by mouth daily with lunch.     furosemide (LASIX) 20 MG tablet Take 20 mg by mouth daily.     gabapentin (NEURONTIN) 600 MG tablet Take 300 mg by mouth at bedtime.     hydrALAZINE (APRESOLINE) 10 MG tablet Take 0.5 tablets (5 mg total) by mouth every 8 (eight) hours. 90 tablet 0   hydrocortisone 2.5 % cream  Apply 1 application topically 2 (two) times daily as needed (itching/rash).     irbesartan (AVAPRO) 300 MG tablet Take 1 tablet (300 mg total) by mouth daily. 30 tablet 0   isosorbide mononitrate (IMDUR) 60 MG 24 hr tablet Take 1 tablet (60 mg total) by mouth daily. 30 tablet 0   levothyroxine (SYNTHROID) 25 MCG tablet Take 25 mcg by mouth daily before breakfast.     Multiple Vitamins-Minerals (PRESERVISION AREDS 2) CAPS Take 1 capsule by mouth every morning.     Omega-3 Fatty Acids (FISH OIL) 1000 MG CAPS Take 1,000 mg by mouth at bedtime.     pantoprazole (PROTONIX) 40 MG tablet Take 40 mg by mouth every morning.     polyethylene glycol powder  (GLYCOLAX/MIRALAX) 17 GM/SCOOP powder Take 17 g by mouth daily as needed for mild constipation. 510 g 0   spironolactone (ALDACTONE) 25 MG tablet Take 25 mg by mouth daily.     SSD 1 % cream Apply 1 application topically daily. Apply to middle to on right foot     traMADol (ULTRAM) 50 MG tablet Take 1 tablet (50 mg total) by mouth every 12 (twelve) hours. 7 tablet 0   triamcinolone (KENALOG) 0.1 % Apply 1 application topically 2 (two) times daily as needed (itching/rash).     vitamin B-12 (CYANOCOBALAMIN) 1000 MCG tablet Take 0.5 tablets (500 mcg total) by mouth daily. 30 tablet 0   No current facility-administered medications on file prior to visit.   Allergies  Allergen Reactions   Statins Other (See Comments)    musculoskeletal aches and pains, can't take      Objective: There were no vitals filed for this visit.  General: Patient is awake, alert, oriented x 3 and in no acute distress.  Dermatology: Skin is warm callused over skin at the right 3rd toe, no malodor, no active drainage, resolved blanchable erythema to the right third toe, resolved localized edema. No other acute signs of infection.  Nails x10 are thickened and elongated consistent with onychomycosis.   Vascular: Dorsalis Pedis pulse = 1/4 Bilateral,  Posterior Tibial pulse = 0/4 Bilateral,  Capillary Fill Time < 5 seconds, 1+ pitting edema bilateral ankles.  Neurologic: Protective sensation present via light touch bilateral.  Musculosketal: No significant pain to palpation to the right third toe.  Hammertoe gross bony deformities noted bilateral.  Pes planus foot type noted bilateral.  Fat pad atrophy noted bilateral.  Recent Labs    11/28/20 1619 02/22/21 1305  LABORGA ESCHERICHIA COLI* ESCHERICHIA COLI*    Assessment and Plan:  Problem List Items Addressed This Visit   None Visit Diagnoses     Toe ulcer, right, limited to breakdown of skin ([place])    -  Primary   HEALED   Hammer toes of both feet        Plantar fat pad atrophy       PVD (peripheral vascular disease) ([place])            -Examined patient  -Mechanically debrided nails x 10 using a sterile nail nipper without incident -Right 3rd toe remains healed and is callused over -Discussed with patient hammertoes are semi-rigid and at this time I think a tenotomy will do very little for the toe -Advised patient to await the modified orthotics meanwhile may continue with offloading padding to the area on her current insoles until her adjusted ones are in -Patient to return to office in 10 weeks for nail care and when called for PUO  or sooner if problems or issues arise  Asencion Islam, DPM

## 2021-04-18 ENCOUNTER — Telehealth: Payer: Self-pay | Admitting: Sports Medicine

## 2021-04-18 NOTE — Telephone Encounter (Signed)
Orthotics in [employer] office.. called pts daughter to get pt scheduled to pick the orthotics up but no voicemail is set up so I was not able to leave a message.

## 2021-04-20 ENCOUNTER — Ambulatory Visit: Payer: Medicare Other

## 2021-04-20 ENCOUNTER — Other Ambulatory Visit: Payer: Self-pay

## 2021-04-20 DIAGNOSIS — M2042 Other hammer toe(s) (acquired), left foot: Secondary | ICD-10-CM

## 2021-04-20 DIAGNOSIS — M2041 Other hammer toe(s) (acquired), right foot: Secondary | ICD-10-CM

## 2021-04-20 NOTE — Progress Notes (Signed)
SITUATION: Reason for Visit: Fitting and Delivery of Custom Fabricated Foot Orthoses Patient Report: Patient reports comfort and is satisfied with device.  OBJECTIVE DATA: Patient History / Diagnosis:  No change in pathology Provided Device:  Custom accomodative foot orthoses  GOAL OF ORTHOSIS - Improve gait - Decrease energy expenditure - Improve Balance - Provide Triplanar stability of foot complex - Facilitate motion  ACTIONS PERFORMED Patient was fit with foot orthoses trimmed to shoe last. Patient tolerated fittign procedure. Device was modified as follows to better fit patient: - Toe plate was trimmed to shoe last  Patient was provided with verbal and written instruction and demonstration regarding donning, doffing, wear, care, proper fit, function, purpose, cleaning, and use of the orthosis and in all related precautions and risks and benefits regarding the orthosis.  Patient was also provided with verbal instruction regarding how to report any failures or malfunctions of the orthosis and necessary follow up care. Patient was also instructed to contact our office regarding any change in status that may affect the function of the orthosis.  Patient demonstrated independence with proper donning, doffing, and fit and verbalized understanding of all instructions.  PLAN: Patient is to follow up in one week or as necessary (PRN). All questions were answered and concerns addressed. Plan of care was discussed with and agreed upon by the patient.

## 2021-05-25 ENCOUNTER — Telehealth: Payer: Self-pay | Admitting: Sports Medicine

## 2021-05-25 ENCOUNTER — Other Ambulatory Visit: Payer: Self-pay | Admitting: Sports Medicine

## 2021-05-25 MED ORDER — CEPHALEXIN 500 MG PO CAPS: 500.0000 mg | ORAL_CAPSULE | Freq: Three times a day (TID) | ORAL | 0 refills | Status: DC

## 2021-05-25 NOTE — Telephone Encounter (Signed)
Pt has infection/hole in toe rt foot-made appt for 06-02-21.  Did you want to call in antibiotic.  [facility]

## 2021-05-25 NOTE — Telephone Encounter (Signed)
Pt's daughter notified/reb 

## 2021-05-25 NOTE — Telephone Encounter (Signed)
Vm full-unable to leave mg/reb

## 2021-05-25 NOTE — Progress Notes (Signed)
Rx Keflex until patient can be seen

## 2021-06-02 ENCOUNTER — Ambulatory Visit (INDEPENDENT_AMBULATORY_CARE_PROVIDER_SITE_OTHER): Payer: Medicare Other

## 2021-06-02 ENCOUNTER — Other Ambulatory Visit: Payer: Self-pay

## 2021-06-02 ENCOUNTER — Ambulatory Visit (INDEPENDENT_AMBULATORY_CARE_PROVIDER_SITE_OTHER): Payer: Medicare Other | Admitting: Sports Medicine

## 2021-06-02 ENCOUNTER — Ambulatory Visit: Payer: Medicare Other

## 2021-06-02 ENCOUNTER — Encounter: Payer: Self-pay | Admitting: Sports Medicine

## 2021-06-02 VITALS — Temp 96.2°F

## 2021-06-02 DIAGNOSIS — M2041 Other hammer toe(s) (acquired), right foot: Secondary | ICD-10-CM

## 2021-06-02 DIAGNOSIS — L84 Corns and callosities: Secondary | ICD-10-CM

## 2021-06-02 DIAGNOSIS — M79674 Pain in right toe(s): Secondary | ICD-10-CM | POA: Diagnosis not present

## 2021-06-02 NOTE — Progress Notes (Signed)
Subjective: Chelsea Moss is a 88 y.o. female patient seen in office for follow-up evaluation of right third toe pain.  Daughter reports that toe is swollen did have an episode of draining but has stopped since mom has been keeping it clean with alcohol and seems to be inflamed and painful.  Currently taking oral antibiotics.  Denies any other pedal complaints at this time.  Patient is assisted by daughter this visit.  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 07/07/2019   Bilateral carotid artery stenosis 07/07/2019   Gait disturbance 07/07/2019   History of CEA (carotid endarterectomy) 07/07/2019   Hx-TIA (transient ischemic attack) 07/07/2019   Stage 3b chronic kidney disease ([place]) 07/07/2019   Statin intolerance 07/07/2019   Chronic bilateral low back pain without sciatica 08/14/2016   Iron deficiency anemia 06/25/2016   Advance directive on file 04/21/2016   Macular degeneration 02/29/2016   Left tibialis posterior tendinitis 12/23/2014   Essential hypertension 08/12/2014   Menopausal symptoms 02/04/2014   Hyperlipidemia, unspecified 01/06/2012   Osteopenia 01/06/2012   Renal insufficiency 01/06/2012   Acquired hypothyroidism 12/10/2011   Current Outpatient Medications on File Prior to Visit  Medication Sig Dispense Refill   acetaminophen (TYLENOL) 500 MG tablet Take 1,000 mg by mouth at bedtime.     amLODipine (NORVASC) 10 MG tablet Take 10 mg by mouth daily.     ascorbic acid (VITAMIN C) 500 MG tablet Take 0.5 tablets (250 mg total) by mouth daily. 30 tablet 0   aspirin 81 MG EC tablet Take 1 tablet (81 mg total) by mouth daily. Swallow whole. 30 tablet 0   baclofen (LIORESAL) 10 MG tablet Take 10 mg by  mouth 2 (two) times daily as needed.     BIOTIN PO Take 1 tablet by mouth daily with lunch.     busPIRone (BUSPAR) 5 MG tablet Take 5 mg by mouth 2 (two) times daily.     Calcium Carbonate-Vitamin D (CALCIUM-D PO) Take 1 tablet by mouth See admin instructions. 1 tablet by mouth daily with lunch and at bedtime     carvedilol (COREG) 3.125 MG tablet Take 1 tablet (3.125 mg total) by mouth 2 (two) times daily with a meal. 60 tablet 0   cephALEXin (KEFLEX) 500 MG capsule Take 1 capsule (500 mg total) by mouth 3 (three) times daily. 30 capsule 0   cloNIDine (CATAPRES) 0.2 MG tablet Take 0.2 mg by mouth 3 (three) times daily.     clopidogrel (PLAVIX) 75 MG tablet Take 75 mg by mouth at bedtime.     diazepam (VALIUM) 5 MG tablet Take 5 mg by mouth 2 (two) times daily as needed.     diphenhydramine-acetaminophen (TYLENOL PM) 25-500 MG TABS tablet Take 1 tablet by mouth at bedtime as needed (sleep).     estradiol (ESTRACE) 2 MG tablet Take 2 mg by mouth every morning.     ezetimibe (ZETIA) 10 MG tablet Take 1 tablet (10 mg total) by mouth daily. 30 tablet 0   ferrous sulfate 325 (65 FE) MG tablet Take 325 mg by mouth daily with lunch.     furosemide (LASIX) 20 MG tablet Take 20 mg by mouth daily.     gabapentin (NEURONTIN) 600 MG tablet Take 300  mg by mouth at bedtime.     hydrALAZINE (APRESOLINE) 10 MG tablet Take 0.5 tablets (5 mg total) by mouth every 8 (eight) hours. 90 tablet 0   hydrALAZINE (APRESOLINE) 50 MG tablet Take by mouth.     hydrocortisone 2.5 % cream Apply 1 application topically 2 (two) times daily as needed (itching/rash).     irbesartan (AVAPRO) 300 MG tablet Take 1 tablet (300 mg total) by mouth daily. 30 tablet 0   isosorbide mononitrate (IMDUR) 60 MG 24 hr tablet Take 1 tablet (60 mg total) by mouth daily. 30 tablet 0   levothyroxine (SYNTHROID) 25 MCG tablet Take 25 mcg by mouth daily before breakfast.     levothyroxine (SYNTHROID) 50 MCG tablet Take 50 mcg by mouth daily.      Multiple Vitamins-Minerals (PRESERVISION AREDS 2) CAPS Take 1 capsule by mouth every morning.     nitrofurantoin, macrocrystal-monohydrate, (MACROBID) 100 MG capsule Take 100 mg by mouth 2 (two) times daily.     Omega-3 Fatty Acids (FISH OIL) 1000 MG CAPS Take 1,000 mg by mouth at bedtime.     pantoprazole (PROTONIX) 40 MG tablet Take 40 mg by mouth every morning.     polyethylene glycol powder (GLYCOLAX/MIRALAX) 17 GM/SCOOP powder Take 17 g by mouth daily as needed for mild constipation. 510 g 0   spironolactone (ALDACTONE) 25 MG tablet Take 25 mg by mouth daily.     SSD 1 % cream Apply 1 application topically daily. Apply to middle to on right foot     traMADol (ULTRAM) 50 MG tablet Take 1 tablet (50 mg total) by mouth every 12 (twelve) hours. 7 tablet 0   triamcinolone (KENALOG) 0.1 % Apply 1 application topically 2 (two) times daily as needed (itching/rash).     vitamin B-12 (CYANOCOBALAMIN) 1000 MCG tablet Take 0.5 tablets (500 mcg total) by mouth daily. 30 tablet 0   No current facility-administered medications on file prior to visit.   Allergies  Allergen Reactions   Statins Other (See Comments)    musculoskeletal aches and pains, can't take      Objective: There were no vitals filed for this visit.  General: Patient is awake, alert, oriented x 3 and in no acute distress.  Dermatology: Skin is warm callused over skin at the right 3rd toe, no malodor, no active drainage, resolved blanchable erythema to the right third toe, resolved localized edema. No other acute signs of infection.  Nails x10 are thickened and elongated consistent with onychomycosis.   Vascular: Dorsalis Pedis pulse = 1/4 Bilateral,  Posterior Tibial pulse = 0/4 Bilateral,  Capillary Fill Time < 5 seconds, 1+ pitting edema bilateral ankles.  Neurologic: Protective sensation present via light touch bilateral.  Musculosketal: No significant pain to palpation to the right third toe.  Hammertoe gross bony  deformities noted bilateral.  Pes planus foot type noted bilateral.  Fat pad atrophy noted bilateral.  Recent Labs    11/28/20 1619 02/22/21 1305  [facility] COLI*    Assessment and Plan:  Problem List Items Addressed This Visit   None Visit Diagnoses     Hammertoe of right foot    -  Primary   Relevant Orders   DG Foot Complete Right   Toe pain, right       Pre-ulcerative calluses            -Examined patient  -Mechanically debrided nails x 10 using a sterile nail nipper without incident -Right 3rd toe remains  healed and is callused over -X-rays are negative for any acute osteomyelitis at the right third toe -Discussed with patient hammertoes are semi-rigid but at this time we can attempt to try a tenotomy even though it may not work because of the semirigid nature of the toe -After written consent, the right third toe was prepped with Betadine and local anesthetic was administered utilizing 1% lidocaine plain and 0.5% Marcaine plain in a local field block fashion after anesthesia was confirmed using a 18-gauge needle the flexor tendon at the plantar aspect of the right third toe was released sterile dressing was applied and instructed patient to change dressing using Band-Aid starting on Sunday onto the area has healed.  Patient tolerated procedure anesthesia well without complication of the flexor tenotomy release for the hammertoe that was performed today in office -Advised patient to continue with good supportive shoes and custom orthotics as tolerated -Advised patient to continue with oral antibiotics until completed -Patient to return to office as scheduled within 2 weeks for toe check or sooner if problems or issues arise  Landis Martins, DPM

## 2021-06-14 ENCOUNTER — Ambulatory Visit: Payer: Medicare Other | Admitting: Sports Medicine

## 2021-06-26 ENCOUNTER — Telehealth: Payer: Self-pay | Admitting: Sports Medicine

## 2021-06-26 NOTE — Telephone Encounter (Signed)
Daughter req antibiotics for left middle toe.  States it is exact issue she had on right toe.  Also wants to get scheduled asap for you to do the same prcoedure.  Pls advise   [facility] [address] [address]

## 2021-06-27 ENCOUNTER — Other Ambulatory Visit: Payer: Self-pay | Admitting: Sports Medicine

## 2021-06-27 MED ORDER — CEPHALEXIN 500 MG PO CAPS: 500.0000 mg | ORAL_CAPSULE | Freq: Three times a day (TID) | ORAL | 0 refills | Status: DC

## 2021-06-27 NOTE — Progress Notes (Signed)
Antibiotics refilled. Schedule her for next available in the next in 2 weeks for tenotomy procedure (30 mins)

## 2021-06-27 NOTE — Telephone Encounter (Signed)
Daughter notified & pt scheduled/reb

## 2021-07-18 ENCOUNTER — Other Ambulatory Visit: Payer: Self-pay | Admitting: Sports Medicine

## 2021-07-18 ENCOUNTER — Encounter: Payer: Self-pay | Admitting: Sports Medicine

## 2021-07-18 ENCOUNTER — Ambulatory Visit (INDEPENDENT_AMBULATORY_CARE_PROVIDER_SITE_OTHER): Payer: Medicare Other

## 2021-07-18 ENCOUNTER — Other Ambulatory Visit: Payer: Self-pay

## 2021-07-18 ENCOUNTER — Ambulatory Visit (INDEPENDENT_AMBULATORY_CARE_PROVIDER_SITE_OTHER): Payer: Medicare Other | Admitting: Sports Medicine

## 2021-07-18 DIAGNOSIS — M79672 Pain in left foot: Secondary | ICD-10-CM

## 2021-07-18 DIAGNOSIS — M2042 Other hammer toe(s) (acquired), left foot: Secondary | ICD-10-CM

## 2021-07-18 DIAGNOSIS — M6240 Contracture of muscle, unspecified site: Secondary | ICD-10-CM

## 2021-07-18 DIAGNOSIS — L84 Corns and callosities: Secondary | ICD-10-CM

## 2021-07-18 NOTE — Progress Notes (Signed)
Subjective: Chelsea Moss is a 88 y.o. female patient seen in office for wound now at left third toe.  Patient is assisted by daughter who reports that the wound on last month looked bad until I put her on antibiotics states that now it is drying up and healing and wants to discuss tenotomy procedure.  Patient denies current nausea vomiting fever chills or any other constitutional symptoms at this time.  No other pedal complaints noted.  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 07/07/2019   Bilateral carotid artery stenosis 07/07/2019   Gait disturbance 07/07/2019   History of CEA (carotid endarterectomy) 07/07/2019   Hx-TIA (transient ischemic attack) 07/07/2019   Stage 3b chronic kidney disease ([place]) 07/07/2019   Statin intolerance 07/07/2019   Chronic bilateral low back pain without sciatica 08/14/2016   Iron deficiency anemia 06/25/2016   Advance directive on file 04/21/2016   Macular degeneration 02/29/2016   Left tibialis posterior tendinitis 12/23/2014   Essential hypertension 08/12/2014   Menopausal symptoms 02/04/2014   Hyperlipidemia, unspecified 01/06/2012   Osteopenia 01/06/2012   Renal insufficiency 01/06/2012   Acquired hypothyroidism 12/10/2011   Current Outpatient Medications on File Prior to Visit  Medication Sig Dispense Refill   acetaminophen (TYLENOL) 500 MG tablet Take 1,000 mg by mouth at bedtime.     amLODipine (NORVASC) 10 MG tablet Take 10 mg by mouth daily.     ascorbic acid (VITAMIN C) 500 MG tablet Take 0.5 tablets (250 mg total) by mouth daily. 30 tablet 0   aspirin 81 MG EC tablet Take 1 tablet (81 mg total) by mouth daily. Swallow whole. 30 tablet 0   baclofen (LIORESAL) 10 MG tablet  Take 10 mg by mouth 2 (two) times daily as needed.     BIOTIN PO Take 1 tablet by mouth daily with lunch.     busPIRone (BUSPAR) 5 MG tablet Take 5 mg by mouth 2 (two) times daily.     Calcium Carbonate-Vitamin D (CALCIUM-D PO) Take 1 tablet by mouth See admin instructions. 1 tablet by mouth daily with lunch and at bedtime     carvedilol (COREG) 3.125 MG tablet Take 1 tablet (3.125 mg total) by mouth 2 (two) times daily with a meal. 60 tablet 0   cephALEXin (KEFLEX) 500 MG capsule Take 1 capsule (500 mg total) by mouth 3 (three) times daily. 30 capsule 0   cloNIDine (CATAPRES) 0.2 MG tablet Take 0.2 mg by mouth 3 (three) times daily.     clopidogrel (PLAVIX) 75 MG tablet Take 75 mg by mouth at bedtime.     diazepam (VALIUM) 5 MG tablet Take 5 mg by mouth 2 (two) times daily as needed.     diphenhydramine-acetaminophen (TYLENOL PM) 25-500 MG TABS tablet Take 1 tablet by mouth at bedtime as needed (sleep).     estradiol (ESTRACE) 2 MG tablet Take 2 mg by mouth every morning.     ezetimibe (ZETIA) 10 MG tablet Take 1 tablet (10 mg total) by mouth daily. 30 tablet 0   ferrous sulfate 325 (65 FE) MG tablet Take 325 mg by mouth daily with lunch.     furosemide (LASIX) 20 MG tablet Take 20 mg by mouth daily.     gabapentin (NEURONTIN) 600  MG tablet Take 300 mg by mouth at bedtime.     hydrALAZINE (APRESOLINE) 10 MG tablet Take 0.5 tablets (5 mg total) by mouth every 8 (eight) hours. 90 tablet 0   hydrALAZINE (APRESOLINE) 50 MG tablet Take by mouth.     hydrocortisone 2.5 % cream Apply 1 application topically 2 (two) times daily as needed (itching/rash).     irbesartan (AVAPRO) 300 MG tablet Take 1 tablet (300 mg total) by mouth daily. 30 tablet 0   isosorbide mononitrate (IMDUR) 60 MG 24 hr tablet Take 1 tablet (60 mg total) by mouth daily. 30 tablet 0   levothyroxine (SYNTHROID) 25 MCG tablet Take 25 mcg by mouth daily before breakfast.     levothyroxine (SYNTHROID) 50 MCG tablet Take 50 mcg by mouth  daily.     Multiple Vitamins-Minerals (PRESERVISION AREDS 2) CAPS Take 1 capsule by mouth every morning.     nitrofurantoin, macrocrystal-monohydrate, (MACROBID) 100 MG capsule Take 100 mg by mouth 2 (two) times daily.     Omega-3 Fatty Acids (FISH OIL) 1000 MG CAPS Take 1,000 mg by mouth at bedtime.     pantoprazole (PROTONIX) 40 MG tablet Take 40 mg by mouth every morning.     polyethylene glycol powder (GLYCOLAX/MIRALAX) 17 GM/SCOOP powder Take 17 g by mouth daily as needed for mild constipation. 510 g 0   spironolactone (ALDACTONE) 25 MG tablet Take 25 mg by mouth daily.     SSD 1 % cream Apply 1 application topically daily. Apply to middle to on right foot     traMADol (ULTRAM) 50 MG tablet Take 1 tablet (50 mg total) by mouth every 12 (twelve) hours. 7 tablet 0   triamcinolone (KENALOG) 0.1 % Apply 1 application topically 2 (two) times daily as needed (itching/rash).     vitamin B-12 (CYANOCOBALAMIN) 1000 MCG tablet Take 0.5 tablets (500 mcg total) by mouth daily. 30 tablet 0   No current facility-administered medications on file prior to visit.   Allergies  Allergen Reactions   Statins Other (See Comments)    musculoskeletal aches and pains, can't take      Objective: There were no vitals filed for this visit.  General: Patient is awake, alert, oriented x 3 and in no acute distress.  Dermatology: Skin is warm callused over skin at the left 3rd toe once debrided there was a small pinpoint opening measures less than 0.1 mm at the distal tuft of the left third toe, no malodor, minimal bleeding, no malodor, no erythema and diffuse pedal swelling noted bilateral 1+ in nature as noted in vascular section.  Nails x10 are short and thickened consistent with onychomycosis.   Vascular: Dorsalis Pedis pulse = 1/4 Bilateral,  Posterior Tibial pulse = 0/4 Bilateral,  Capillary Fill Time < 5 seconds, 1+ pitting edema bilateral ankles.  Neurologic: Protective sensation present via light  touch bilateral.  Musculosketal: Minimal pain to palpation to the left third toe.  Hammertoe gross bony deformities noted bilateral.  Pes planus foot type noted bilateral.  Fat pad atrophy noted bilateral.  X-ray left foot hammertoe deformity no obvious osteomyelitis of the left third toe  Recent Labs    11/28/20 1619 02/22/21 1305  [facility] COLI*    Assessment and Plan:  Problem List Items Addressed This Visit   None Visit Diagnoses     Left foot pain    -  Primary   Relevant Orders   DG Foot Complete Left   Hammertoe of left  foot       Contracture of tendon sheath       Pre-ulcerative calluses            -Examined patient  -X-rays are negative for any acute osteomyelitis at the left third toe -Discussed with patient hammertoes are semi-rigid but at this time we can attempt to try a tenotomy even though it may not work because of the semirigid nature of the toe and history of preulcerative callus/wound -After written consent, the left third toe was prepped with Betadine and local anesthetic was administered utilizing 1% lidocaine plain and 0.5% Marcaine plain in a local field block fashion after anesthesia was confirmed using a 18-gauge needle the flexor tendon at the plantar aspect of the left third toe was released sterile dressing was applied and instructed patient to change dressing using Band-Aid starting on Thursday onto the area has healed.  Patient tolerated procedure anesthesia well without complication of the flexor tenotomy release for the hammertoe that was performed today in office -Advised patient to continue with good supportive shoes that do not rub toes and custom orthotics as tolerated -Previous round of antibiotics have been completed -Patient to return to office as scheduled within 1-2 weeks for toe check or sooner if problems or issues arise  Landis Martins, DPM

## 2021-07-25 ENCOUNTER — Other Ambulatory Visit: Payer: Self-pay

## 2021-07-25 ENCOUNTER — Ambulatory Visit (INDEPENDENT_AMBULATORY_CARE_PROVIDER_SITE_OTHER): Payer: Medicare Other | Admitting: Sports Medicine

## 2021-07-25 ENCOUNTER — Encounter: Payer: Self-pay | Admitting: Sports Medicine

## 2021-07-25 VITALS — Temp 96.8°F

## 2021-07-25 DIAGNOSIS — M6240 Contracture of muscle, unspecified site: Secondary | ICD-10-CM

## 2021-07-25 DIAGNOSIS — L02612 Cutaneous abscess of left foot: Secondary | ICD-10-CM

## 2021-07-25 DIAGNOSIS — M79672 Pain in left foot: Secondary | ICD-10-CM

## 2021-07-25 DIAGNOSIS — L03032 Cellulitis of left toe: Secondary | ICD-10-CM

## 2021-07-25 DIAGNOSIS — R2242 Localized swelling, mass and lump, left lower limb: Secondary | ICD-10-CM

## 2021-07-25 DIAGNOSIS — M2042 Other hammer toe(s) (acquired), left foot: Secondary | ICD-10-CM

## 2021-07-25 MED ORDER — CEPHALEXIN 500 MG PO CAPS: 500.0000 mg | ORAL_CAPSULE | Freq: Three times a day (TID) | ORAL | 0 refills | Status: AC

## 2021-07-25 NOTE — Progress Notes (Signed)
Subjective: ?Chelsea Moss is a 88 y.o. female patient seen today in office for POV #1 (DOS 07/18/2021), S/P left third toe flexor tenotomy.  Patient is assisted by daughter who states that it is draining increased redness and states that there is still some swelling on the foot.  Daughter reports that she has noticed an odor and patient has noticed increased swelling but denies nausea vomiting fever chills or any constitutional symptoms at this time. ? ?Patient Active Problem List  ? Diagnosis Date Noted  ? Hypertensive urgency 11/20/2020  ? Hypertensive emergency 11/19/2020  ? Chronic heart failure with preserved ejection fraction ([place]) 11/11/2020  ? Lacunar stroke ([place]) 11/11/2020  ? Advanced age 89/25/2021  ? 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 Bilateral carotid artery stenosis 07/07/2019  ? Gait disturbance 07/07/2019  ? History of CEA (carotid endarterectomy) 07/07/2019  ? Hx-TIA (transient ischemic attack) 07/07/2019  ? Stage 3b chronic kidney disease ([place]) 07/07/2019  ? Statin intolerance 07/07/2019  ? Chronic bilateral low back pain without sciatica 08/14/2016  ? Iron deficiency anemia 06/25/2016  ? Advance directive on file 04/21/2016  ? Macular degeneration 02/29/2016  ? Left tibialis posterior tendinitis 12/23/2014  ? Essential hypertension 08/12/2014  ? Menopausal symptoms 02/04/2014  ? Hyperlipidemia, unspecified 01/06/2012  ? Osteopenia 01/06/2012  ? Renal insufficiency 01/06/2012  ? Acquired hypothyroidism 12/10/2011  ? ? ?Current Outpatient Medications on File Prior to Visit  ?Medication Sig Dispense Refill  ? acetaminophen (TYLENOL) 500 MG tablet Take 1,000 mg by mouth at bedtime.    ? amLODipine (NORVASC) 10 MG tablet Take 10 mg by mouth daily.    ? ascorbic acid (VITAMIN C) 500 MG tablet Take 0.5 tablets (250 mg total) by mouth daily. 30 tablet 0  ? aspirin 81 MG EC tablet Take 1 tablet (81 mg total) by mouth daily. Swallow whole. 30 tablet 0  ? baclofen  (LIORESAL) 10 MG tablet Take 10 mg by mouth 2 (two) times daily as needed.    ? BIOTIN PO Take 1 tablet by mouth daily with lunch.    ? busPIRone (BUSPAR) 5 MG tablet Take 5 mg by mouth 2 (two) times daily.    ? Calcium Carbonate-Vitamin D (CALCIUM-D PO) Take 1 tablet by mouth See admin instructions. 1 tablet by mouth daily with lunch and at bedtime    ? carvedilol (COREG) 3.125 MG tablet Take 1 tablet (3.125 mg total) by mouth 2 (two) times daily with a meal. 60 tablet 0  ? cloNIDine (CATAPRES) 0.2 MG tablet Take 0.2 mg by mouth 3 (three) times daily.    ? clopidogrel (PLAVIX) 75 MG tablet Take 75 mg by mouth at bedtime.    ? diazepam (VALIUM) 5 MG tablet Take 5 mg by mouth 2 (two) times daily as needed.    ? diphenhydramine-acetaminophen (TYLENOL PM) 25-500 MG TABS tablet Take 1 tablet by mouth at bedtime as needed (sleep).    ? estradiol (ESTRACE) 2 MG tablet Take 2 mg by mouth every morning.    ? ezetimibe (ZETIA) 10 MG tablet Take 1 tablet (10 mg total) by mouth daily. 30 tablet 0  ? ferrous sulfate 325 (65 FE) MG tablet Take 325 mg by mouth daily with lunch.    ? furosemide (LASIX) 20 MG tablet Take 20 mg by mouth daily.    ? gabapentin (NEURONTIN) 600 MG tablet Take 300 mg by mouth at bedtime.    ? hydrALAZINE (APRESOLINE) 10 MG tablet Take 0.5 tablets (5  mg total) by mouth every 8 (eight) hours. 90 tablet 0  ? hydrALAZINE (APRESOLINE) 50 MG tablet Take by mouth.    ? hydrocortisone 2.5 % cream Apply 1 application topically 2 (two) times daily as needed (itching/rash).    ? irbesartan (AVAPRO) 300 MG tablet Take 1 tablet (300 mg total) by mouth daily. 30 tablet 0  ? isosorbide mononitrate (IMDUR) 60 MG 24 hr tablet Take 1 tablet (60 mg total) by mouth daily. 30 tablet 0  ? levothyroxine (SYNTHROID) 25 MCG tablet Take 25 mcg by mouth daily before breakfast.    ? levothyroxine (SYNTHROID) 50 MCG tablet Take 50 mcg by mouth daily.    ? Multiple Vitamins-Minerals (PRESERVISION AREDS 2) CAPS Take 1 capsule by  mouth every morning.    ? nitrofurantoin, macrocrystal-monohydrate, (MACROBID) 100 MG capsule Take 100 mg by mouth 2 (two) times daily.    ? Omega-3 Fatty Acids (FISH OIL) 1000 MG CAPS Take 1,000 mg by mouth at bedtime.    ? pantoprazole (PROTONIX) 40 MG tablet Take 40 mg by mouth every morning.    ? polyethylene glycol powder (GLYCOLAX/MIRALAX) 17 GM/SCOOP powder Take 17 g by mouth daily as needed for mild constipation. 510 g 0  ? spironolactone (ALDACTONE) 25 MG tablet Take 25 mg by mouth daily.    ? SSD 1 % cream Apply 1 application topically daily. Apply to middle to on right foot    ? traMADol (ULTRAM) 50 MG tablet Take 1 tablet (50 mg total) by mouth every 12 (twelve) hours. 7 tablet 0  ? triamcinolone (KENALOG) 0.1 % Apply 1 application topically 2 (two) times daily as needed (itching/rash).    ? vitamin B-12 (CYANOCOBALAMIN) 1000 MCG tablet Take 0.5 tablets (500 mcg total) by mouth daily. 30 tablet 0  ? ?No current facility-administered medications on file prior to visit.  ? ? ?Allergies  ?Allergen Reactions  ? Statins Other (See Comments)  ?  musculoskeletal aches and pains, can't take  ? ? ?Objective: ?There were no vitals filed for this visit. ? ?General: No acute distress, AAOx3  ?Left foot: There is a ruptured blister noted at the left third toe with swelling to the level of the MTPJ's dorsally, previous ulceration is prematurely healed at the distal tuft of the left third toe and the surgical tenotomy wound appears to be healed but there is significant maceration to this area.  There is localized redness and warmth and 1+ pitting edema noted bilateral left greater than right. ? ?Assessment and Plan:  ?Problem List Items Addressed This Visit   ?None ?Visit Diagnoses   ? ? Cellulitis and abscess of toe of left foot    -  Primary  ? Hammertoe of left foot      ? Contracture of tendon sheath      ? Localized swelling of left foot      ? Left foot pain      ? ?  ?  ? ?-Patient seen and evaluated ?-Cleanse  left foot with wound cleanser ?-Applied dry sterile dressing to surgical site left foot secured with [place] and advised patient to soak with Epson salt starting tonight and pat dry very well leave open to air and in the morning apply Betadine and gauze dressing ?-Prescribed Keflex for patient to take for the next 10 days ?-Advised patient to avoid a shoe that could rub or irritate the toe and to elevate the foot to help with swelling ?-Advised patient if redness worsens to return  to office or report to urgent care or ER ?-Return to office in 10 days or sooner if problems or issues arise. ? ?Landis Martins, DPM ? ?

## 2021-08-01 ENCOUNTER — Telehealth: Payer: Self-pay | Admitting: *Deleted

## 2021-08-01 NOTE — Telephone Encounter (Signed)
Daughter called yesterday and left a message about her mom who saw Dr Marylene Land and the daughter stated that the foot was red and swelling and per Dr Marylene Land had stated to the daughter when they were in last visit if it got worse to go the the ER or the urgent care to get stronger antibiotics and stated to call the office if any concerns or questions. Misty Stanley ?

## 2021-08-01 NOTE — Telephone Encounter (Signed)
-----   Message from Asencion Islam, [place] sent at 07/31/2021  6:33 PM EDT ----- ?Regarding: RE: ?I told them if it was getting worse then that means that the oral antibiotics are not working. They need to go to local ER or Urgent care for stronger antibiotics.  ?Thanks ?Dr. Kathie Rhodes ?----- Message ----- ?From: Lanney Gins, PMAC ?Sent: 07/31/2021   4:51 PM EDT ?To: Asencion Islam, DPM ? ?Daughter called and stated that the foot was red and swelling and did not know if they need to come to urgent care or ER or get an antibiotic. Misty Stanley ? ?

## 2021-08-03 DIAGNOSIS — L03116 Cellulitis of left lower limb: Secondary | ICD-10-CM | POA: Insufficient documentation

## 2021-08-04 ENCOUNTER — Encounter: Payer: Self-pay | Admitting: Sports Medicine

## 2021-08-04 ENCOUNTER — Other Ambulatory Visit: Payer: Self-pay

## 2021-08-04 ENCOUNTER — Ambulatory Visit (INDEPENDENT_AMBULATORY_CARE_PROVIDER_SITE_OTHER): Payer: Medicare Other | Admitting: Sports Medicine

## 2021-08-04 VITALS — Temp 96.5°F

## 2021-08-04 DIAGNOSIS — M2042 Other hammer toe(s) (acquired), left foot: Secondary | ICD-10-CM

## 2021-08-04 DIAGNOSIS — R2242 Localized swelling, mass and lump, left lower limb: Secondary | ICD-10-CM

## 2021-08-04 DIAGNOSIS — M79675 Pain in left toe(s): Secondary | ICD-10-CM

## 2021-08-04 DIAGNOSIS — M79674 Pain in right toe(s): Secondary | ICD-10-CM

## 2021-08-04 DIAGNOSIS — M6240 Contracture of muscle, unspecified site: Secondary | ICD-10-CM

## 2021-08-04 DIAGNOSIS — B351 Tinea unguium: Secondary | ICD-10-CM

## 2021-08-04 DIAGNOSIS — L03032 Cellulitis of left toe: Secondary | ICD-10-CM

## 2021-08-04 DIAGNOSIS — M79672 Pain in left foot: Secondary | ICD-10-CM

## 2021-08-04 DIAGNOSIS — L02612 Cutaneous abscess of left foot: Secondary | ICD-10-CM

## 2021-08-04 NOTE — Progress Notes (Signed)
Subjective: ?Chelsea Moss is a 88 y.o. female patient seen today in office for POV #2 (DOS 07/18/2021), S/P left third toe flexor tenotomy.  Patient is assisted by daughter who reports that they went to urgent care a few days after last visit because the redness was still there even after a few doses of the Keflex so they did blood work and put her on Augmentin and slowly the redness has improved but is still draining some in between the toes.  She also states that the nails are long and catching on things and she wants them trimmed this visit.  Patient denies nausea vomiting fever chills or any constitutional symptoms at this time. ? ?Patient Active Problem List  ? Diagnosis Date Noted  ? Cellulitis of left lower extremity 08/03/2021  ? Hypertensive urgency 11/20/2020  ? Hypertensive emergency 11/19/2020  ? Chronic heart failure with preserved ejection fraction ([place]) 11/11/2020  ? Lacunar stroke ([place]) 11/11/2020  ? Advanced age 73/25/2021  ? 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 Bilateral carotid artery stenosis 07/07/2019  ? Gait disturbance 07/07/2019  ? History of CEA (carotid endarterectomy) 07/07/2019  ? Hx-TIA (transient ischemic attack) 07/07/2019  ? Stage 3b chronic kidney disease ([place]) 07/07/2019  ? Statin intolerance 07/07/2019  ? Chronic bilateral low back pain without sciatica 08/14/2016  ? Iron deficiency anemia 06/25/2016  ? Advance directive on file 04/21/2016  ? Macular degeneration 02/29/2016  ? Left tibialis posterior tendinitis 12/23/2014  ? Essential hypertension 08/12/2014  ? Menopausal symptoms 02/04/2014  ? Hyperlipidemia, unspecified 01/06/2012  ? Osteopenia 01/06/2012  ? Renal insufficiency 01/06/2012  ? Acquired hypothyroidism 12/10/2011  ? ? ?Current Outpatient Medications on File Prior to Visit  ?Medication Sig Dispense Refill  ? amoxicillin-clavulanate (AUGMENTIN) 875-125 MG tablet Take by mouth.    ? acetaminophen (TYLENOL) 500 MG tablet Take 1,000 mg by  mouth at bedtime.    ? amLODipine (NORVASC) 10 MG tablet Take 10 mg by mouth daily.    ? ascorbic acid (VITAMIN C) 500 MG tablet Take 0.5 tablets (250 mg total) by mouth daily. 30 tablet 0  ? aspirin 81 MG EC tablet Take 1 tablet (81 mg total) by mouth daily. Swallow whole. 30 tablet 0  ? baclofen (LIORESAL) 10 MG tablet Take 10 mg by mouth 2 (two) times daily as needed.    ? BIOTIN PO Take 1 tablet by mouth daily with lunch.    ? busPIRone (BUSPAR) 5 MG tablet Take 5 mg by mouth 2 (two) times daily.    ? Calcium Carbonate-Vitamin D (CALCIUM-D PO) Take 1 tablet by mouth See admin instructions. 1 tablet by mouth daily with lunch and at bedtime    ? carvedilol (COREG) 3.125 MG tablet Take 1 tablet (3.125 mg total) by mouth 2 (two) times daily with a meal. 60 tablet 0  ? cephALEXin (KEFLEX) 500 MG capsule Take 1 capsule (500 mg total) by mouth 3 (three) times daily. 30 capsule 0  ? cloNIDine (CATAPRES) 0.2 MG tablet Take 0.2 mg by mouth 3 (three) times daily.    ? clopidogrel (PLAVIX) 75 MG tablet Take 75 mg by mouth at bedtime.    ? diazepam (VALIUM) 5 MG tablet Take 5 mg by mouth 2 (two) times daily as needed.    ? diclofenac (VOLTAREN) 75 MG EC tablet Take by mouth.    ? diphenhydramine-acetaminophen (TYLENOL PM) 25-500 MG TABS tablet Take 1 tablet by mouth at bedtime as needed (sleep).    ?  estradiol (ESTRACE) 2 MG tablet Take 2 mg by mouth every morning.    ? ezetimibe (ZETIA) 10 MG tablet Take 1 tablet (10 mg total) by mouth daily. 30 tablet 0  ? ferrous sulfate 325 (65 FE) MG tablet Take 325 mg by mouth daily with lunch.    ? furosemide (LASIX) 20 MG tablet Take 20 mg by mouth daily.    ? gabapentin (NEURONTIN) 600 MG tablet Take 300 mg by mouth at bedtime.    ? hydrALAZINE (APRESOLINE) 10 MG tablet Take 0.5 tablets (5 mg total) by mouth every 8 (eight) hours. 90 tablet 0  ? hydrALAZINE (APRESOLINE) 50 MG tablet Take by mouth.    ? hydrocortisone 2.5 % cream Apply 1 application topically 2 (two) times daily as  needed (itching/rash).    ? irbesartan (AVAPRO) 300 MG tablet Take 1 tablet (300 mg total) by mouth daily. 30 tablet 0  ? isosorbide mononitrate (IMDUR) 60 MG 24 hr tablet Take 1 tablet (60 mg total) by mouth daily. 30 tablet 0  ? levothyroxine (SYNTHROID) 25 MCG tablet Take 25 mcg by mouth daily before breakfast.    ? levothyroxine (SYNTHROID) 50 MCG tablet Take 50 mcg by mouth daily.    ? Multiple Vitamins-Minerals (PRESERVISION AREDS 2) CAPS Take 1 capsule by mouth every morning.    ? nitrofurantoin, macrocrystal-monohydrate, (MACROBID) 100 MG capsule Take 100 mg by mouth 2 (two) times daily.    ? Omega-3 Fatty Acids (FISH OIL) 1000 MG CAPS Take 1,000 mg by mouth at bedtime.    ? pantoprazole (PROTONIX) 40 MG tablet Take 40 mg by mouth every morning.    ? polyethylene glycol powder (GLYCOLAX/MIRALAX) 17 GM/SCOOP powder Take 17 g by mouth daily as needed for mild constipation. 510 g 0  ? spironolactone (ALDACTONE) 25 MG tablet Take 25 mg by mouth daily.    ? SSD 1 % cream Apply 1 application topically daily. Apply to middle to on right foot    ? traMADol (ULTRAM) 50 MG tablet Take 1 tablet (50 mg total) by mouth every 12 (twelve) hours. 7 tablet 0  ? triamcinolone (KENALOG) 0.1 % Apply 1 application topically 2 (two) times daily as needed (itching/rash).    ? vitamin B-12 (CYANOCOBALAMIN) 1000 MCG tablet Take 0.5 tablets (500 mcg total) by mouth daily. 30 tablet 0  ? ?No current facility-administered medications on file prior to visit.  ? ? ?Allergies  ?Allergen Reactions  ? Statins Other (See Comments)  ?  musculoskeletal aches and pains, can't take  ? ? ?Objective: ?There were no vitals filed for this visit. ? ?General: No acute distress, AAOx3  ?Left foot: There is a ruptured blister noted at the left third toe medial aspect this ruptured blister has a granular base and very minimal clear to yellow serous drainage with significantly decreased swelling to the level of the IPJ dorsally, previous ulceration is  prematurely healed at the distal tuft of the left third toe and the surgical tenotomy wound appears to be healed but there is still maceration to this area.  There is decreased localized redness and warmth and very minimal trace edema to the left foot.  Nails x10 are thickened and elongated consistent with onychomycosis. ? ?Assessment and Plan:  ?Problem List Items Addressed This Visit   ?None ?Visit Diagnoses   ? ? Cellulitis and abscess of toe of left foot    -  Primary  ? Hammertoe of left foot      ? Contracture of  tendon sheath      ? Localized swelling of left foot      ? Left foot pain      ? Pain due to onychomycosis of toenails of both feet      ? ?  ?  ? ?-Patient seen and evaluated ?-Cleanse left foot with ruptured blister at the left third toe with cleanser ?-Applied Betadine dry sterile dressing to surgical site left foot secured with clean sock and advised daughter to continue with daily soaking with Epsom salt open to air at bedtime and Betadine in between the toes during the day ?-Continue with Augmentin until completed to end on Monday ?-May continue with follow-up for another opinion on wound care from the wound care center as scheduled on Monday ?-Dispensed surgical shoe for patient to wear to prevent rubbing or crowding of the toes to assist with healing of ruptured blister/wound at the left third toe ?-Advised patient if cellulitis or infection worsens to return to office or report to urgent care or ER ?-At no additional charge mechanically debrided all painful nails x10 using a sterile nail nipper without incident ?-Return to office after seen by wound care center as needed or sooner if problems or issues arise. ? ?Landis Martins, DPM ? ?

## 2021-08-07 ENCOUNTER — Telehealth: Payer: Self-pay | Admitting: Sports Medicine

## 2021-08-07 NOTE — Telephone Encounter (Signed)
Chelsea Moss from [facility] called they need a referral for wound care. They also need office notes sent to them. Patient was send in their office today, they cannot provide treatment until they get referral    Fax:  [phone]

## 2021-08-07 NOTE — Telephone Encounter (Signed)
Faxed 08/07/21
